# Patient Record
Sex: Female | Born: 1935 | Race: White | Hispanic: No | Smoking: Never smoker
Health system: Southern US, Community
[De-identification: ages and names within clinical notes are randomized; demographics above are authoritative.]

## PROBLEM LIST (undated history)

## (undated) DIAGNOSIS — R410 Disorientation, unspecified: Secondary | ICD-10-CM

## (undated) DIAGNOSIS — I10 Essential (primary) hypertension: Secondary | ICD-10-CM

## (undated) DIAGNOSIS — F0390 Unspecified dementia without behavioral disturbance: Secondary | ICD-10-CM

## (undated) DIAGNOSIS — C801 Malignant (primary) neoplasm, unspecified: Secondary | ICD-10-CM

## (undated) HISTORY — PX: BLADDER SURGERY: SHX569

## (undated) HISTORY — PX: ABDOMINAL HYSTERECTOMY: SHX81

---

## 1993-01-22 HISTORY — PX: BREAST LUMPECTOMY: SHX2

## 2000-10-24 ENCOUNTER — Ambulatory Visit (HOSPITAL_COMMUNITY): Admission: RE | Admit: 2000-10-24 | Discharge: 2000-10-24 | Payer: Self-pay | Admitting: Otolaryngology

## 2000-10-24 ENCOUNTER — Encounter: Payer: Self-pay | Admitting: Otolaryngology

## 2000-11-21 ENCOUNTER — Ambulatory Visit (HOSPITAL_COMMUNITY): Admission: RE | Admit: 2000-11-21 | Discharge: 2000-11-21 | Payer: Self-pay | Admitting: Oncology

## 2000-11-21 ENCOUNTER — Encounter (HOSPITAL_COMMUNITY): Payer: Self-pay | Admitting: Oncology

## 2001-03-01 ENCOUNTER — Emergency Department (HOSPITAL_COMMUNITY): Admission: EM | Admit: 2001-03-01 | Discharge: 2001-03-01 | Payer: Self-pay | Admitting: Emergency Medicine

## 2001-03-01 ENCOUNTER — Encounter: Payer: Self-pay | Admitting: Emergency Medicine

## 2001-04-08 ENCOUNTER — Encounter: Admission: RE | Admit: 2001-04-08 | Discharge: 2001-04-08 | Payer: Self-pay | Admitting: Oncology

## 2001-04-08 ENCOUNTER — Encounter (HOSPITAL_COMMUNITY): Admission: RE | Admit: 2001-04-08 | Discharge: 2001-05-08 | Payer: Self-pay | Admitting: Oncology

## 2001-11-25 ENCOUNTER — Encounter: Admission: RE | Admit: 2001-11-25 | Discharge: 2001-11-25 | Payer: Self-pay | Admitting: Oncology

## 2001-11-25 ENCOUNTER — Encounter (HOSPITAL_COMMUNITY): Payer: Self-pay | Admitting: Oncology

## 2001-11-25 ENCOUNTER — Encounter (HOSPITAL_COMMUNITY): Admission: RE | Admit: 2001-11-25 | Discharge: 2001-12-25 | Payer: Self-pay | Admitting: Oncology

## 2002-04-10 ENCOUNTER — Encounter: Admission: RE | Admit: 2002-04-10 | Discharge: 2002-04-10 | Payer: Self-pay | Admitting: Oncology

## 2002-04-10 ENCOUNTER — Encounter (HOSPITAL_COMMUNITY): Admission: RE | Admit: 2002-04-10 | Discharge: 2002-05-10 | Payer: Self-pay | Admitting: Oncology

## 2002-12-08 ENCOUNTER — Ambulatory Visit (HOSPITAL_COMMUNITY): Admission: RE | Admit: 2002-12-08 | Discharge: 2002-12-08 | Payer: Self-pay | Admitting: Oncology

## 2003-04-02 ENCOUNTER — Ambulatory Visit (HOSPITAL_COMMUNITY): Admission: RE | Admit: 2003-04-02 | Discharge: 2003-04-02 | Payer: Self-pay | Admitting: Pulmonary Disease

## 2003-04-12 ENCOUNTER — Encounter: Admission: RE | Admit: 2003-04-12 | Discharge: 2003-04-12 | Payer: Self-pay | Admitting: Oncology

## 2003-04-12 ENCOUNTER — Encounter (HOSPITAL_COMMUNITY): Admission: RE | Admit: 2003-04-12 | Discharge: 2003-05-12 | Payer: Self-pay | Admitting: Oncology

## 2003-04-29 ENCOUNTER — Ambulatory Visit (HOSPITAL_COMMUNITY): Admission: RE | Admit: 2003-04-29 | Discharge: 2003-04-29 | Payer: Self-pay | Admitting: Urology

## 2003-09-29 ENCOUNTER — Other Ambulatory Visit: Admission: RE | Admit: 2003-09-29 | Discharge: 2003-09-29 | Payer: Self-pay | Admitting: Dermatology

## 2004-01-13 ENCOUNTER — Encounter: Admission: RE | Admit: 2004-01-13 | Discharge: 2004-01-26 | Payer: Self-pay | Admitting: Oncology

## 2004-01-13 ENCOUNTER — Encounter (HOSPITAL_COMMUNITY): Admission: RE | Admit: 2004-01-13 | Discharge: 2004-01-26 | Payer: Self-pay | Admitting: Oncology

## 2004-04-11 ENCOUNTER — Encounter: Admission: RE | Admit: 2004-04-11 | Discharge: 2004-04-11 | Payer: Self-pay | Admitting: Oncology

## 2004-04-11 ENCOUNTER — Ambulatory Visit (HOSPITAL_COMMUNITY): Payer: Self-pay | Admitting: Oncology

## 2004-04-11 ENCOUNTER — Encounter (HOSPITAL_COMMUNITY): Admission: RE | Admit: 2004-04-11 | Discharge: 2004-05-11 | Payer: Self-pay | Admitting: Oncology

## 2005-01-23 ENCOUNTER — Encounter (HOSPITAL_COMMUNITY): Admission: RE | Admit: 2005-01-23 | Discharge: 2005-02-22 | Payer: Self-pay | Admitting: Oncology

## 2005-01-23 ENCOUNTER — Encounter: Admission: RE | Admit: 2005-01-23 | Discharge: 2005-01-23 | Payer: Self-pay | Admitting: Oncology

## 2005-04-11 ENCOUNTER — Ambulatory Visit (HOSPITAL_COMMUNITY): Payer: Self-pay | Admitting: Oncology

## 2005-04-11 ENCOUNTER — Encounter: Admission: RE | Admit: 2005-04-11 | Discharge: 2005-04-11 | Payer: Self-pay | Admitting: Oncology

## 2006-01-25 ENCOUNTER — Ambulatory Visit (HOSPITAL_COMMUNITY): Admission: RE | Admit: 2006-01-25 | Discharge: 2006-01-25 | Payer: Self-pay | Admitting: Obstetrics and Gynecology

## 2006-04-10 ENCOUNTER — Encounter (HOSPITAL_COMMUNITY): Admission: RE | Admit: 2006-04-10 | Discharge: 2006-05-10 | Payer: Self-pay | Admitting: Oncology

## 2006-04-10 ENCOUNTER — Ambulatory Visit (HOSPITAL_COMMUNITY): Payer: Self-pay | Admitting: Oncology

## 2006-07-03 ENCOUNTER — Emergency Department (HOSPITAL_COMMUNITY): Admission: EM | Admit: 2006-07-03 | Discharge: 2006-07-03 | Payer: Self-pay | Admitting: Emergency Medicine

## 2007-02-03 ENCOUNTER — Ambulatory Visit (HOSPITAL_COMMUNITY): Admission: RE | Admit: 2007-02-03 | Discharge: 2007-02-03 | Payer: Self-pay | Admitting: Obstetrics and Gynecology

## 2008-02-25 ENCOUNTER — Ambulatory Visit (HOSPITAL_COMMUNITY): Admission: RE | Admit: 2008-02-25 | Discharge: 2008-02-25 | Payer: Self-pay | Admitting: Pulmonary Disease

## 2008-05-09 ENCOUNTER — Emergency Department (HOSPITAL_COMMUNITY): Admission: EM | Admit: 2008-05-09 | Discharge: 2008-05-09 | Payer: Self-pay | Admitting: Emergency Medicine

## 2009-03-10 ENCOUNTER — Ambulatory Visit (HOSPITAL_COMMUNITY): Admission: RE | Admit: 2009-03-10 | Discharge: 2009-03-10 | Payer: Self-pay | Admitting: Pulmonary Disease

## 2009-05-02 ENCOUNTER — Ambulatory Visit (HOSPITAL_COMMUNITY): Admission: RE | Admit: 2009-05-02 | Discharge: 2009-05-02 | Payer: Self-pay | Admitting: Family Medicine

## 2010-03-16 ENCOUNTER — Other Ambulatory Visit: Payer: Self-pay | Admitting: Obstetrics and Gynecology

## 2010-03-16 DIAGNOSIS — Z139 Encounter for screening, unspecified: Secondary | ICD-10-CM

## 2010-03-21 ENCOUNTER — Ambulatory Visit (HOSPITAL_COMMUNITY)
Admission: RE | Admit: 2010-03-21 | Discharge: 2010-03-21 | Disposition: A | Discharge status: Home / Self Care | Payer: Medicare Other | Source: Ambulatory Visit | Attending: Obstetrics and Gynecology | Admitting: Obstetrics and Gynecology

## 2010-03-21 DIAGNOSIS — Z139 Encounter for screening, unspecified: Secondary | ICD-10-CM

## 2010-03-21 DIAGNOSIS — Z1231 Encounter for screening mammogram for malignant neoplasm of breast: Secondary | ICD-10-CM | POA: Insufficient documentation

## 2010-06-09 NOTE — Procedures (Signed)
NAME:  Sara Bowman, Sara Bowman                        ACCOUNT NO.:  [id number]   MEDICAL RECORD NO.:  [id number]                   PATIENT TYPE:  OUT   LOCATION:  RAD                                  FACILITY:  [facility]   PHYSICIAN:  Dani Gobble, MD                    DATE OF BIRTH:  1923/08/14   DATE OF PROCEDURE:  DATE OF DISCHARGE:                                  ECHOCARDIOGRAM   TECHNICAL QUALITY:  The technical quality of the study is adequate.   INDICATION:  [name] is a 77 year old female with a past medical  history of hypertension who has experienced a TIA.   FINDINGS:  1. The aorta is within normal limits at 2.9 cm.  2. The left atrium is also within normal limits at 2.9 cm.  The patient     appeared to be in sinus rhythm during this procedure.  No obvious clots     or masses were appreciated.  3. The intraventricular septum was notable for basal septal hypertrophy as     is common in the elderly.  Otherwise, the septum and posterior wall were     within normal limits in thickness.  4. The aortic valve was trileaflet with mild calcification on the right     coronary cusp.  Leaflet excursion was minimally diminished but certainly     adequate.  Mild aortic insufficiency was appreciated.  5. Doppler interrogation of the aortic valve reveals peak velocity of 1.9     meters per second corresponding to a peak gradient of 15 mmHg and a mean     gradient of 9 mmHg.  6. The mitral valve was mildly thickened but with normal leaflet excursion.     No mitral valve prolapse is noted.  Mild mitral annular calcification was     noted.  Mild mitral regurgitation was noted.  Doppler interrogation of     the mitral valve was within normal limits.  7. The pulmonic valve was not well visualized.  8. The tricuspid valve appeared grossly structurally normal.  9. The left ventricle was normal in size with the LVIDD measured at 3.4 cm     and the LVISD measured at 2.4 cm.  Overall, left  ventricular systolic     function was normal, and no regional wall motion abnormalities were     appreciated.  10.      The presence of diastolic dysfunction is inferred from pulse wave     Doppler across the mitral valve.  The right atrium grossly appears to be     within normal limits in size.  The right ventricle is mildly dilated but     with preserved right ventricular systolic function.  11.      The IVC is normal in size with good collapse.   IMPRESSION:  1. Mild basal septal hypertrophy.  2. Mild aortic sclerosis to very  mild aortic stenosis but with adequate     leaflet excursion remaining.  3. Mild aortic insufficiency.  4. Mild mitral annular calcification.  5. Mild mitral regurgitation.  6. Normal left ventricular size and systolic function without regional wall     motion abnormality noted.  7. The presence of diastolic dysfunction is inferred from pulse wave Doppler     across the mitral valve.  8. Mild right ventricular enlargement with preserved right ventricular     systolic function.      ___________________________________________                                            Dani Gobble, MD   AB/MEDQ  D:  04/02/2003  T:  04/02/2003  Job:  [id number]   cc:   Ramon Dredge L. Juanetta Gosling, M.D.  [address]  [place]  [place] [address]  Fax: [phone]

## 2010-11-09 LAB — BASIC METABOLIC PANEL
BUN: 7
CO2: 29
Chloride: 100
Creatinine, Ser: 0.73
Potassium: 3.8

## 2010-11-09 LAB — DIFFERENTIAL
Basophils Absolute: 0
Eosinophils Relative: 0
Lymphocytes Relative: 15
Lymphs Abs: 1.2
Monocytes Absolute: 0.2
Monocytes Relative: 3
Neutro Abs: 6.5

## 2010-11-09 LAB — CBC
HCT: 39.7
Hemoglobin: 13.9
RBC: 4.57
WBC: 7.9

## 2011-02-01 DIAGNOSIS — E785 Hyperlipidemia, unspecified: Secondary | ICD-10-CM | POA: Diagnosis not present

## 2011-02-01 DIAGNOSIS — M1990 Unspecified osteoarthritis, unspecified site: Secondary | ICD-10-CM | POA: Diagnosis not present

## 2011-02-01 DIAGNOSIS — I10 Essential (primary) hypertension: Secondary | ICD-10-CM | POA: Diagnosis not present

## 2011-04-04 ENCOUNTER — Other Ambulatory Visit (HOSPITAL_COMMUNITY): Payer: Self-pay | Admitting: Pulmonary Disease

## 2011-04-04 DIAGNOSIS — Z139 Encounter for screening, unspecified: Secondary | ICD-10-CM

## 2011-04-05 ENCOUNTER — Ambulatory Visit (HOSPITAL_COMMUNITY)
Admission: RE | Admit: 2011-04-05 | Discharge: 2011-04-05 | Disposition: A | Discharge status: Home / Self Care | Payer: Medicare Other | Source: Ambulatory Visit | Attending: Pulmonary Disease | Admitting: Pulmonary Disease

## 2011-04-05 DIAGNOSIS — Z139 Encounter for screening, unspecified: Secondary | ICD-10-CM

## 2011-04-05 DIAGNOSIS — Z1231 Encounter for screening mammogram for malignant neoplasm of breast: Secondary | ICD-10-CM | POA: Diagnosis not present

## 2011-05-24 DIAGNOSIS — D235 Other benign neoplasm of skin of trunk: Secondary | ICD-10-CM | POA: Diagnosis not present

## 2011-05-24 DIAGNOSIS — L821 Other seborrheic keratosis: Secondary | ICD-10-CM | POA: Diagnosis not present

## 2011-05-24 DIAGNOSIS — T6391XA Toxic effect of contact with unspecified venomous animal, accidental (unintentional), initial encounter: Secondary | ICD-10-CM | POA: Diagnosis not present

## 2011-05-24 DIAGNOSIS — Z85828 Personal history of other malignant neoplasm of skin: Secondary | ICD-10-CM | POA: Diagnosis not present

## 2011-05-31 DIAGNOSIS — G459 Transient cerebral ischemic attack, unspecified: Secondary | ICD-10-CM | POA: Diagnosis not present

## 2011-05-31 DIAGNOSIS — E785 Hyperlipidemia, unspecified: Secondary | ICD-10-CM | POA: Diagnosis not present

## 2011-05-31 DIAGNOSIS — M1990 Unspecified osteoarthritis, unspecified site: Secondary | ICD-10-CM | POA: Diagnosis not present

## 2011-05-31 DIAGNOSIS — I10 Essential (primary) hypertension: Secondary | ICD-10-CM | POA: Diagnosis not present

## 2011-08-27 DIAGNOSIS — I10 Essential (primary) hypertension: Secondary | ICD-10-CM | POA: Diagnosis not present

## 2011-08-27 DIAGNOSIS — L0390 Cellulitis, unspecified: Secondary | ICD-10-CM | POA: Diagnosis not present

## 2011-08-27 DIAGNOSIS — L0291 Cutaneous abscess, unspecified: Secondary | ICD-10-CM | POA: Diagnosis not present

## 2011-10-02 DIAGNOSIS — I10 Essential (primary) hypertension: Secondary | ICD-10-CM | POA: Diagnosis not present

## 2011-10-02 DIAGNOSIS — E785 Hyperlipidemia, unspecified: Secondary | ICD-10-CM | POA: Diagnosis not present

## 2011-10-02 DIAGNOSIS — G459 Transient cerebral ischemic attack, unspecified: Secondary | ICD-10-CM | POA: Diagnosis not present

## 2011-12-01 DIAGNOSIS — Z23 Encounter for immunization: Secondary | ICD-10-CM | POA: Diagnosis not present

## 2012-01-31 DIAGNOSIS — G459 Transient cerebral ischemic attack, unspecified: Secondary | ICD-10-CM | POA: Diagnosis not present

## 2012-01-31 DIAGNOSIS — E785 Hyperlipidemia, unspecified: Secondary | ICD-10-CM | POA: Diagnosis not present

## 2012-01-31 DIAGNOSIS — I10 Essential (primary) hypertension: Secondary | ICD-10-CM | POA: Diagnosis not present

## 2012-05-24 DIAGNOSIS — N390 Urinary tract infection, site not specified: Secondary | ICD-10-CM | POA: Diagnosis not present

## 2012-05-29 DIAGNOSIS — N390 Urinary tract infection, site not specified: Secondary | ICD-10-CM | POA: Diagnosis not present

## 2012-05-29 DIAGNOSIS — Z79899 Other long term (current) drug therapy: Secondary | ICD-10-CM | POA: Diagnosis not present

## 2012-05-29 DIAGNOSIS — E785 Hyperlipidemia, unspecified: Secondary | ICD-10-CM | POA: Diagnosis not present

## 2012-05-29 DIAGNOSIS — G459 Transient cerebral ischemic attack, unspecified: Secondary | ICD-10-CM | POA: Diagnosis not present

## 2012-05-29 DIAGNOSIS — M1990 Unspecified osteoarthritis, unspecified site: Secondary | ICD-10-CM | POA: Diagnosis not present

## 2012-05-29 DIAGNOSIS — I10 Essential (primary) hypertension: Secondary | ICD-10-CM | POA: Diagnosis not present

## 2012-06-03 DIAGNOSIS — I10 Essential (primary) hypertension: Secondary | ICD-10-CM | POA: Diagnosis not present

## 2012-06-03 DIAGNOSIS — M1990 Unspecified osteoarthritis, unspecified site: Secondary | ICD-10-CM | POA: Diagnosis not present

## 2012-06-03 DIAGNOSIS — G459 Transient cerebral ischemic attack, unspecified: Secondary | ICD-10-CM | POA: Diagnosis not present

## 2012-06-03 DIAGNOSIS — Z79899 Other long term (current) drug therapy: Secondary | ICD-10-CM | POA: Diagnosis not present

## 2012-06-03 DIAGNOSIS — E785 Hyperlipidemia, unspecified: Secondary | ICD-10-CM | POA: Diagnosis not present

## 2012-09-15 DIAGNOSIS — I10 Essential (primary) hypertension: Secondary | ICD-10-CM | POA: Diagnosis not present

## 2012-09-15 DIAGNOSIS — M1990 Unspecified osteoarthritis, unspecified site: Secondary | ICD-10-CM | POA: Diagnosis not present

## 2012-09-15 DIAGNOSIS — E785 Hyperlipidemia, unspecified: Secondary | ICD-10-CM | POA: Diagnosis not present

## 2012-09-16 DIAGNOSIS — M79609 Pain in unspecified limb: Secondary | ICD-10-CM | POA: Diagnosis not present

## 2012-09-16 DIAGNOSIS — B351 Tinea unguium: Secondary | ICD-10-CM | POA: Diagnosis not present

## 2012-09-16 DIAGNOSIS — M2010 Hallux valgus (acquired), unspecified foot: Secondary | ICD-10-CM | POA: Diagnosis not present

## 2012-09-30 DIAGNOSIS — H52229 Regular astigmatism, unspecified eye: Secondary | ICD-10-CM | POA: Diagnosis not present

## 2012-09-30 DIAGNOSIS — H5200 Hypermetropia, unspecified eye: Secondary | ICD-10-CM | POA: Diagnosis not present

## 2012-09-30 DIAGNOSIS — H353190 Nonexudative age-related macular degeneration, unspecified eye, stage unspecified: Secondary | ICD-10-CM | POA: Diagnosis not present

## 2012-09-30 DIAGNOSIS — H524 Presbyopia: Secondary | ICD-10-CM | POA: Diagnosis not present

## 2012-10-20 DIAGNOSIS — H18419 Arcus senilis, unspecified eye: Secondary | ICD-10-CM | POA: Diagnosis not present

## 2012-10-20 DIAGNOSIS — H02839 Dermatochalasis of unspecified eye, unspecified eyelid: Secondary | ICD-10-CM | POA: Diagnosis not present

## 2012-10-20 DIAGNOSIS — H2510 Age-related nuclear cataract, unspecified eye: Secondary | ICD-10-CM | POA: Diagnosis not present

## 2012-10-20 DIAGNOSIS — H18459 Nodular corneal degeneration, unspecified eye: Secondary | ICD-10-CM | POA: Diagnosis not present

## 2012-10-20 DIAGNOSIS — H25019 Cortical age-related cataract, unspecified eye: Secondary | ICD-10-CM | POA: Diagnosis not present

## 2012-11-12 DIAGNOSIS — H2510 Age-related nuclear cataract, unspecified eye: Secondary | ICD-10-CM | POA: Diagnosis not present

## 2012-11-12 DIAGNOSIS — H269 Unspecified cataract: Secondary | ICD-10-CM | POA: Diagnosis not present

## 2012-11-19 DIAGNOSIS — H2510 Age-related nuclear cataract, unspecified eye: Secondary | ICD-10-CM | POA: Diagnosis not present

## 2012-11-19 DIAGNOSIS — H269 Unspecified cataract: Secondary | ICD-10-CM | POA: Diagnosis not present

## 2012-12-16 DIAGNOSIS — E785 Hyperlipidemia, unspecified: Secondary | ICD-10-CM | POA: Diagnosis not present

## 2012-12-16 DIAGNOSIS — I10 Essential (primary) hypertension: Secondary | ICD-10-CM | POA: Diagnosis not present

## 2012-12-16 DIAGNOSIS — Z23 Encounter for immunization: Secondary | ICD-10-CM | POA: Diagnosis not present

## 2012-12-16 DIAGNOSIS — M1990 Unspecified osteoarthritis, unspecified site: Secondary | ICD-10-CM | POA: Diagnosis not present

## 2012-12-16 DIAGNOSIS — Q842 Other congenital malformations of hair: Secondary | ICD-10-CM | POA: Diagnosis not present

## 2013-04-14 DIAGNOSIS — K2100 Gastro-esophageal reflux disease with esophagitis, without bleeding: Secondary | ICD-10-CM | POA: Diagnosis not present

## 2013-04-14 DIAGNOSIS — J309 Allergic rhinitis, unspecified: Secondary | ICD-10-CM | POA: Diagnosis not present

## 2013-04-14 DIAGNOSIS — I10 Essential (primary) hypertension: Secondary | ICD-10-CM | POA: Diagnosis not present

## 2013-04-14 DIAGNOSIS — R609 Edema, unspecified: Secondary | ICD-10-CM | POA: Diagnosis not present

## 2013-07-15 DIAGNOSIS — Z961 Presence of intraocular lens: Secondary | ICD-10-CM | POA: Diagnosis not present

## 2013-07-15 DIAGNOSIS — H26499 Other secondary cataract, unspecified eye: Secondary | ICD-10-CM | POA: Diagnosis not present

## 2013-07-29 DIAGNOSIS — H26499 Other secondary cataract, unspecified eye: Secondary | ICD-10-CM | POA: Diagnosis not present

## 2013-07-29 DIAGNOSIS — Z961 Presence of intraocular lens: Secondary | ICD-10-CM | POA: Diagnosis not present

## 2013-08-13 DIAGNOSIS — G459 Transient cerebral ischemic attack, unspecified: Secondary | ICD-10-CM | POA: Diagnosis not present

## 2013-08-13 DIAGNOSIS — I10 Essential (primary) hypertension: Secondary | ICD-10-CM | POA: Diagnosis not present

## 2013-08-13 DIAGNOSIS — E785 Hyperlipidemia, unspecified: Secondary | ICD-10-CM | POA: Diagnosis not present

## 2013-08-13 DIAGNOSIS — M1990 Unspecified osteoarthritis, unspecified site: Secondary | ICD-10-CM | POA: Diagnosis not present

## 2013-12-06 DIAGNOSIS — Z23 Encounter for immunization: Secondary | ICD-10-CM | POA: Diagnosis not present

## 2013-12-14 DIAGNOSIS — I10 Essential (primary) hypertension: Secondary | ICD-10-CM | POA: Diagnosis not present

## 2013-12-14 DIAGNOSIS — K219 Gastro-esophageal reflux disease without esophagitis: Secondary | ICD-10-CM | POA: Diagnosis not present

## 2013-12-14 DIAGNOSIS — M158 Other polyosteoarthritis: Secondary | ICD-10-CM | POA: Diagnosis not present

## 2013-12-14 DIAGNOSIS — F0390 Unspecified dementia without behavioral disturbance: Secondary | ICD-10-CM | POA: Diagnosis not present

## 2014-02-11 DIAGNOSIS — I10 Essential (primary) hypertension: Secondary | ICD-10-CM | POA: Diagnosis not present

## 2014-02-11 DIAGNOSIS — F0390 Unspecified dementia without behavioral disturbance: Secondary | ICD-10-CM | POA: Diagnosis not present

## 2014-02-11 DIAGNOSIS — M158 Other polyosteoarthritis: Secondary | ICD-10-CM | POA: Diagnosis not present

## 2014-02-11 DIAGNOSIS — G459 Transient cerebral ischemic attack, unspecified: Secondary | ICD-10-CM | POA: Diagnosis not present

## 2014-03-04 DIAGNOSIS — D225 Melanocytic nevi of trunk: Secondary | ICD-10-CM | POA: Diagnosis not present

## 2014-03-04 DIAGNOSIS — L570 Actinic keratosis: Secondary | ICD-10-CM | POA: Diagnosis not present

## 2014-03-04 DIAGNOSIS — X32XXXD Exposure to sunlight, subsequent encounter: Secondary | ICD-10-CM | POA: Diagnosis not present

## 2014-04-12 DIAGNOSIS — M158 Other polyosteoarthritis: Secondary | ICD-10-CM | POA: Diagnosis not present

## 2014-04-12 DIAGNOSIS — I10 Essential (primary) hypertension: Secondary | ICD-10-CM | POA: Diagnosis not present

## 2014-04-12 DIAGNOSIS — J309 Allergic rhinitis, unspecified: Secondary | ICD-10-CM | POA: Diagnosis not present

## 2014-04-12 DIAGNOSIS — F0390 Unspecified dementia without behavioral disturbance: Secondary | ICD-10-CM | POA: Diagnosis not present

## 2014-06-14 DIAGNOSIS — F0390 Unspecified dementia without behavioral disturbance: Secondary | ICD-10-CM | POA: Diagnosis not present

## 2014-06-14 DIAGNOSIS — R42 Dizziness and giddiness: Secondary | ICD-10-CM | POA: Diagnosis not present

## 2014-06-14 DIAGNOSIS — K210 Gastro-esophageal reflux disease with esophagitis: Secondary | ICD-10-CM | POA: Diagnosis not present

## 2014-06-14 DIAGNOSIS — I10 Essential (primary) hypertension: Secondary | ICD-10-CM | POA: Diagnosis not present

## 2014-08-17 ENCOUNTER — Inpatient Hospital Stay (HOSPITAL_COMMUNITY)
Admission: EM | Admit: 2014-08-17 | Discharge: 2014-08-19 | DRG: 948 | Disposition: A | Discharge status: Home / Self Care | Payer: Medicare Other | Attending: Pulmonary Disease | Admitting: Pulmonary Disease

## 2014-08-17 ENCOUNTER — Emergency Department (HOSPITAL_COMMUNITY): Payer: Medicare Other

## 2014-08-17 ENCOUNTER — Encounter (HOSPITAL_COMMUNITY): Payer: Self-pay | Admitting: Emergency Medicine

## 2014-08-17 DIAGNOSIS — R748 Abnormal levels of other serum enzymes: Principal | ICD-10-CM | POA: Diagnosis present

## 2014-08-17 DIAGNOSIS — R5381 Other malaise: Secondary | ICD-10-CM

## 2014-08-17 DIAGNOSIS — I350 Nonrheumatic aortic (valve) stenosis: Secondary | ICD-10-CM | POA: Diagnosis present

## 2014-08-17 DIAGNOSIS — R7989 Other specified abnormal findings of blood chemistry: Secondary | ICD-10-CM | POA: Diagnosis not present

## 2014-08-17 DIAGNOSIS — R410 Disorientation, unspecified: Secondary | ICD-10-CM | POA: Diagnosis not present

## 2014-08-17 DIAGNOSIS — E440 Moderate protein-calorie malnutrition: Secondary | ICD-10-CM | POA: Diagnosis present

## 2014-08-17 DIAGNOSIS — F0391 Unspecified dementia with behavioral disturbance: Secondary | ICD-10-CM | POA: Diagnosis not present

## 2014-08-17 DIAGNOSIS — N390 Urinary tract infection, site not specified: Secondary | ICD-10-CM | POA: Diagnosis present

## 2014-08-17 DIAGNOSIS — Z853 Personal history of malignant neoplasm of breast: Secondary | ICD-10-CM | POA: Diagnosis not present

## 2014-08-17 DIAGNOSIS — Z90710 Acquired absence of both cervix and uterus: Secondary | ICD-10-CM

## 2014-08-17 DIAGNOSIS — R4182 Altered mental status, unspecified: Secondary | ICD-10-CM | POA: Diagnosis present

## 2014-08-17 DIAGNOSIS — I10 Essential (primary) hypertension: Secondary | ICD-10-CM | POA: Diagnosis not present

## 2014-08-17 DIAGNOSIS — F03918 Unspecified dementia, unspecified severity, with other behavioral disturbance: Secondary | ICD-10-CM

## 2014-08-17 DIAGNOSIS — G9389 Other specified disorders of brain: Secondary | ICD-10-CM | POA: Diagnosis not present

## 2014-08-17 DIAGNOSIS — E871 Hypo-osmolality and hyponatremia: Secondary | ICD-10-CM | POA: Diagnosis not present

## 2014-08-17 DIAGNOSIS — Z681 Body mass index (BMI) 19 or less, adult: Secondary | ICD-10-CM | POA: Diagnosis not present

## 2014-08-17 DIAGNOSIS — R9430 Abnormal result of cardiovascular function study, unspecified: Secondary | ICD-10-CM | POA: Diagnosis not present

## 2014-08-17 DIAGNOSIS — R778 Other specified abnormalities of plasma proteins: Secondary | ICD-10-CM | POA: Diagnosis present

## 2014-08-17 DIAGNOSIS — E785 Hyperlipidemia, unspecified: Secondary | ICD-10-CM | POA: Diagnosis present

## 2014-08-17 DIAGNOSIS — Z79899 Other long term (current) drug therapy: Secondary | ICD-10-CM

## 2014-08-17 DIAGNOSIS — I340 Nonrheumatic mitral (valve) insufficiency: Secondary | ICD-10-CM | POA: Diagnosis not present

## 2014-08-17 HISTORY — DX: Disorientation, unspecified: R41.0

## 2014-08-17 HISTORY — DX: Malignant (primary) neoplasm, unspecified: C80.1

## 2014-08-17 HISTORY — DX: Unspecified dementia, unspecified severity, without behavioral disturbance, psychotic disturbance, mood disturbance, and anxiety: F03.90

## 2014-08-17 HISTORY — DX: Essential (primary) hypertension: I10

## 2014-08-17 LAB — COMPREHENSIVE METABOLIC PANEL
ALBUMIN: 4.5 g/dL (ref 3.5–5.0)
ALT: 19 U/L (ref 14–54)
AST: 34 U/L (ref 15–41)
Alkaline Phosphatase: 69 U/L (ref 38–126)
Anion gap: 9 (ref 5–15)
BUN: 11 mg/dL (ref 6–20)
CO2: 26 mmol/L (ref 22–32)
CREATININE: 0.63 mg/dL (ref 0.44–1.00)
Calcium: 9.7 mg/dL (ref 8.9–10.3)
Chloride: 97 mmol/L — ABNORMAL LOW (ref 101–111)
GFR calc Af Amer: >60 mL/min (ref >60)
Glucose, Bld: 88 mg/dL (ref 65–99)
Potassium: 4 mmol/L (ref 3.5–5.1)
Sodium: 132 mmol/L — ABNORMAL LOW (ref 135–145)
TOTAL PROTEIN: 7.6 g/dL (ref 6.5–8.1)
Total Bilirubin: 0.9 mg/dL (ref 0.3–1.2)

## 2014-08-17 LAB — URINE MICROSCOPIC-ADD ON

## 2014-08-17 LAB — CBC WITH DIFFERENTIAL/PLATELET
BASOS ABS: 0 10*3/uL (ref 0.0–0.1)
Basophils Relative: 0 % (ref 0–1)
EOS PCT: 0 % (ref 0–5)
Eosinophils Absolute: 0 10*3/uL (ref 0.0–0.7)
HCT: 40.8 % (ref 36.0–46.0)
HEMOGLOBIN: 14.4 g/dL (ref 12.0–15.0)
Lymphocytes Relative: 26 % (ref 12–46)
Lymphs Abs: 2 10*3/uL (ref 0.7–4.0)
MCH: 31 pg (ref 26.0–34.0)
MCHC: 35.3 g/dL (ref 30.0–36.0)
MCV: 87.9 fL (ref 78.0–100.0)
Monocytes Absolute: 0.4 10*3/uL (ref 0.1–1.0)
Monocytes Relative: 6 % (ref 3–12)
Neutro Abs: 5.3 10*3/uL (ref 1.7–7.7)
Neutrophils Relative %: 68 % (ref 43–77)
Platelets: 196 10*3/uL (ref 150–400)
RBC: 4.64 MIL/uL (ref 3.87–5.11)
RDW: 12.5 % (ref 11.5–15.5)
WBC: 7.8 10*3/uL (ref 4.0–10.5)

## 2014-08-17 LAB — RAPID URINE DRUG SCREEN, HOSP PERFORMED
AMPHETAMINES: NOT DETECTED
Barbiturates: NOT DETECTED
Benzodiazepines: POSITIVE — AB
Cocaine: NOT DETECTED
OPIATES: NOT DETECTED
Tetrahydrocannabinol: NOT DETECTED

## 2014-08-17 LAB — URINALYSIS, ROUTINE W REFLEX MICROSCOPIC
Bilirubin Urine: NEGATIVE
Glucose, UA: NEGATIVE mg/dL
Hgb urine dipstick: NEGATIVE
KETONES UR: NEGATIVE mg/dL
NITRITE: NEGATIVE
Protein, ur: NEGATIVE mg/dL
Specific Gravity, Urine: 1.01 (ref 1.005–1.030)
Urobilinogen, UA: 0.2 mg/dL (ref 0.0–1.0)
pH: 7 (ref 5.0–8.0)

## 2014-08-17 LAB — TROPONIN I
Troponin I: 0.19 ng/mL — ABNORMAL HIGH (ref <0.031)
Troponin I: 0.23 ng/mL — ABNORMAL HIGH (ref <0.031)

## 2014-08-17 LAB — LACTIC ACID, PLASMA
Lactic Acid, Venous: 1 mmol/L (ref 0.5–2.0)
Lactic Acid, Venous: 0.7 mmol/L (ref 0.5–2.0)

## 2014-08-17 MED ORDER — LISINOPRIL 10 MG PO TABS
20.0000 mg | ORAL_TABLET | Freq: Every day | ORAL | Status: DC
  Administered 2014-08-18 – 2014-08-19 (×2): 20 mg via ORAL
  Filled 2014-08-17 (×2): qty 2

## 2014-08-17 MED ORDER — HEPARIN SODIUM (PORCINE) 5000 UNIT/ML IJ SOLN
5000.0000 [IU] | Freq: Three times a day (TID) | INTRAMUSCULAR | Status: DC
  Administered 2014-08-17 – 2014-08-19 (×5): 5000 [IU] via SUBCUTANEOUS
  Filled 2014-08-17 (×5): qty 1

## 2014-08-17 MED ORDER — ACETAMINOPHEN 650 MG RE SUPP
650.0000 mg | Freq: Four times a day (QID) | RECTAL | Status: DC | PRN
Start: 2014-08-17 — End: 2014-08-19

## 2014-08-17 MED ORDER — SODIUM CHLORIDE 0.9 % IV SOLN
INTRAVENOUS | Status: DC
  Administered 2014-08-18: 21:00:00 via INTRAVENOUS

## 2014-08-17 MED ORDER — ALPRAZOLAM 0.25 MG PO TABS
0.2500 mg | ORAL_TABLET | Freq: Two times a day (BID) | ORAL | Status: DC | PRN
  Administered 2014-08-18: 0.25 mg via ORAL
  Filled 2014-08-17: qty 1

## 2014-08-17 MED ORDER — ACETAMINOPHEN 325 MG PO TABS: 650.0000 mg | ORAL_TABLET | Freq: Four times a day (QID) | ORAL | Status: DC | PRN

## 2014-08-17 MED ORDER — TORSEMIDE 20 MG PO TABS
10.0000 mg | ORAL_TABLET | Freq: Every day | ORAL | Status: DC
  Administered 2014-08-18 – 2014-08-19 (×2): 10 mg via ORAL
  Filled 2014-08-17 (×2): qty 1

## 2014-08-17 MED ORDER — AMLODIPINE BESYLATE 5 MG PO TABS
5.0000 mg | ORAL_TABLET | Freq: Every day | ORAL | Status: DC
  Administered 2014-08-17 – 2014-08-19 (×3): 5 mg via ORAL
  Filled 2014-08-17 (×3): qty 1

## 2014-08-17 MED ORDER — ONDANSETRON HCL 4 MG PO TABS
4.0000 mg | ORAL_TABLET | Freq: Four times a day (QID) | ORAL | Status: DC | PRN
Start: 2014-08-17 — End: 2014-08-19

## 2014-08-17 MED ORDER — OXYCODONE HCL 5 MG PO TABS: 5.0000 mg | ORAL_TABLET | ORAL | Status: DC | PRN

## 2014-08-17 MED ORDER — ONDANSETRON HCL 4 MG/2ML IJ SOLN: 4.0000 mg | Freq: Four times a day (QID) | INTRAMUSCULAR | Status: DC | PRN

## 2014-08-17 MED ORDER — SODIUM CHLORIDE 0.9 % IV SOLN
INTRAVENOUS | Status: AC
  Administered 2014-08-17: 1000 mL via INTRAVENOUS

## 2014-08-17 MED ORDER — SODIUM CHLORIDE 0.9 % IJ SOLN
3.0000 mL | Freq: Two times a day (BID) | INTRAMUSCULAR | Status: DC
  Administered 2014-08-17: 3 mL via INTRAVENOUS

## 2014-08-17 MED ORDER — HALOPERIDOL LACTATE 5 MG/ML IJ SOLN
2.5000 mg | Freq: Once | INTRAMUSCULAR | Status: AC
  Administered 2014-08-17: 2.5 mg via INTRAVENOUS
  Filled 2014-08-17: qty 1

## 2014-08-17 NOTE — Care Management (Signed)
CM referral ordered by EDP. CM discussed case with MD over phone. Pt being admitted for obs. Pt not capable of living at home alone due to confusion, daughter has agreed to assume care for patient. Patient's daughter interested in available home services. CM will see patient after she gets to floor and discuss options for assistance with family.

## 2014-08-17 NOTE — ED Notes (Signed)
Pt c/o sacral pain.  States "she was scooting on a ship last night".  Per daughter when pt ambulates she grimaces in pain.

## 2014-08-17 NOTE — ED Notes (Signed)
Pts daughter states that pt has been exhibiting some forgetfulness over the past few months.  States that she lives next door to the patient and this morning the pt was banging on her door very confused and behaving strangely.  States last seen normal at 8pm last night.

## 2014-08-17 NOTE — H&P (Addendum)
[facility] History and Physical  JNAE THOMASTON VOJ:[id number] DOB: January 20, 1924 DOA: 08/17/2014  Referring physician: Dr. Thurnell Garbe - [facility] PCP: Alonza Bogus, MD   Chief Complaint: AMS   HPI: Sara Bowman is a 81 y.o. female  Bilateral 5 caveat. Patient presenting with altered mental status and severe baseline dementia.  Progressive generalized weakness and change in mentation. Onset 2 weeks ago. Gradual. Getting worse. Patient lives at home by herself but is next door to her daughter regular cares for the patient. At baseline patient the scares her ADLs but is visited daily by her daughter. Patient ministers her own medications but does so without the help of a daily pill box. Patient became acutely more worse last night when she began saying very odd things to her daughter such as there being a bomb in her house and how she went on a ship to [place] and then. Per patient's daughter this morning patient states that she climbed out of her living room window. . Patient's daughter found her in her pajamas and once upper. Patient has no recollection of these events. There is no complaint of chest pain, short of breath, nausea, vomiting, diaphoresis, abdominal pain, palpitations.  Review of Systems:  Per history of present illness with no further review systems able to be obtained due to patient's mental status.    Past Medical History  Diagnosis Date  . Cancer     breast  . Hypertension   . Confusion   . Dementia    Past Surgical History  Procedure Laterality Date  . Abdominal hysterectomy     Social History:  reports that she has never smoked. She does not have any smokeless tobacco history on file. She reports that she does not drink alcohol or use illicit drugs.  No Known Allergies  Family History  Problem Relation Age of Onset  . Aneurysm Mother      Prior to Admission medications   Medication Sig Start Date End Date Taking? Authorizing Provider  ALPRAZolam  Duanne Moron) 0.25 MG tablet Take 1 tablet by mouth 2 (two) times daily as needed for anxiety.  07/08/14  Yes Historical Provider, MD  amLODipine (NORVASC) 5 MG tablet Take 1 tablet by mouth daily. 07/17/14  Yes Historical Provider, MD  ibuprofen (ADVIL,MOTRIN) 200 MG tablet Take 200 mg by mouth every 6 (six) hours as needed for headache.   Yes Historical Provider, MD  torsemide (DEMADEX) 10 MG tablet Take 1 tablet by mouth daily. 05/10/14  Yes Historical Provider, MD  lisinopril (PRINIVIL,ZESTRIL) 20 MG tablet Take 1 tablet by mouth daily. 07/17/14   Historical Provider, MD   Physical Exam: Filed Vitals:   08/17/14 1300 08/17/14 1443 08/17/14 1500 08/17/14 1730  BP: 96/84 154/90 145/75 160/77  Pulse: 94 92 80 82  Temp:    98 F (36.7 C)  TempSrc:    Oral  Resp: 19     Height:    5\' 2"  (1.575 m)  Weight:      SpO2: 100% 95% 100% 100%    Wt Readings from Last 3 Encounters:  08/17/14 48.535 kg (107 lb)    General: Appears calm but frail.  Eyes:  PERRL, normal lids, irises & conjunctiva ENT: Dry mucous membranes  Neck:  no LAD, masses or thyromegaly Cardiovascular: Faint heart sounds, regular rate and rhythm, 06 systolic murmur, No LE edema. Respiratory:  CTA bilaterally, no w/r/r. Normal respiratory effort. Abdomen:  soft, ntnd Skin:  no rash or induration seen on limited exam  Musculoskeletal:  grossly normal tone BUE/BLE Psychiatric:  Follows basic commands. Unable to have coherent conversation.  Neurologic:  No foacl deficits. CN 2-12 grossly intact          Labs on Admission:  Basic Metabolic Panel:  Recent Labs Lab 08/17/14 1313  NA 132*  K 4.0  CL 97*  CO2 26  GLUCOSE 88  BUN 11  CREATININE 0.63  CALCIUM 9.7   Liver Function Tests:  Recent Labs Lab 08/17/14 1313  AST 34  ALT 19  ALKPHOS 69  BILITOT 0.9  PROT 7.6  ALBUMIN 4.5   No results for input(s): LIPASE, AMYLASE in the last 168 hours. No results for input(s): AMMONIA in the last 168  hours. CBC:  Recent Labs Lab 08/17/14 1313  WBC 7.8  NEUTROABS 5.3  HGB 14.4  HCT 40.8  MCV 87.9  PLT 196   Cardiac Enzymes:  Recent Labs Lab 08/17/14 1313  TROPONINI 0.19*    BNP (last 3 results) No results for input(s): BNP in the last 8760 hours.  ProBNP (last 3 results) No results for input(s): PROBNP in the last 8760 hours.  CBG: No results for input(s): GLUCAP in the last 168 hours.  Radiological Exams on Admission: Dg Chest 2 View  08/17/2014   CLINICAL DATA:  Patient is confused and the heating strain distally.  EXAM: CHEST  2 VIEW  COMPARISON:  None.  FINDINGS: The heart size and mediastinal contours are within normal limits. The aorta is slightly uncoiled. There is no focal infiltrate, pulmonary edema, or pleural effusion. Nipple shadows identified in the left lung base. There is scoliosis of spine.  IMPRESSION: No active cardiopulmonary disease.   Electronically Signed   By: Abelardo Diesel M.D.   On: 08/17/2014 13:31   Mr Brain Wo Contrast (neuro Protocol)  08/17/2014   CLINICAL DATA:  Altered mental status  EXAM: MRI HEAD WITHOUT CONTRAST  TECHNIQUE: Multiplanar, multiecho pulse sequences of the brain and surrounding structures were obtained without intravenous contrast.  COMPARISON:  CT head 07/03/2006  FINDINGS: Moderate atrophy.  Negative for hydrocephalus.  Negative for acute infarct.  Mild chronic microvascular ischemic change in the white matter and thalamus. Brainstem intact. Chronic encephalomalacia right inferior frontal lobe unchanged from the prior CT likely related to prior head trauma.  Negative for intracranial hemorrhage. Negative for mass or edema. No shift of the midline structures  Paranasal sinuses clear. Negative orbit. Pituitary not enlarged. Cervical medullary junction normal.  IMPRESSION: Atrophy and chronic microvascular ischemia.  No acute infarct.  Encephalomalacia right inferior frontal lobe consistent with prior head trauma.   Electronically  Signed   By: Franchot Gallo M.D.   On: 08/17/2014 14:08     Assessment/Plan Principal Problem:   Elevated troponin Active Problems:   Altered mental state   Physical deconditioning   Hyponatremia   Essential hypertension   Dementia with behavioral disturbance   Elevated Troponin: 0.19 on admission. No previous cardiac history. Patient has never had a stress test or other cardiac workup.EKG unremarkable. Spoke to Dr. Debara Pickett of cardiology who agrees w/ obs and watch. If troponin >4-5 then start Hep fro 24-48hrs. Likely not a cath candidate.  - Telemetry - Cycle troponins - EKG in a.m. - Cardiology consult in a.m. If trop trends up  AMS: Patient with progressive worsening of her dementia and mental state over the course of the last 2 weeks. Of note patient lives at home and doses her own medications. There is concern for possible  medication etiology as there is no overt evidence of infectious or metabolic etiology. MR brain without acute process noted - Social work for possible nursing home placement - Monitor - PT/OT  Hyponatremia: Na 132. Likely secondary to poor nutrition - IVF - Nutrition consult  HTN: - continue Norvasc, lisinopril, torsemide (LE edema)    Code Status: FULL DVT Prophylaxis: Hep Family Communication: Daughter Disposition Plan: Pending improvement  Bianca Raneri Lenna Sciara, MD Family Medicine [facility] [email or web address] Password [email or web address]

## 2014-08-17 NOTE — Clinical Social Work Note (Signed)
CSW spoke with RN and EDP regarding referral. Pt has been very confused recently. Unsure if pt will require admission at this point. EDP advises that family is in room and willing to take pt home with them, but pt may need some home health services. Please consult CM if needed.  Sara Bowman, [place]

## 2014-08-17 NOTE — ED Provider Notes (Signed)
CSN: [id number]     Arrival date & time 08/17/14  1227 History   First MD Initiated Contact with Patient 08/17/14 1250     Chief Complaint  Patient presents with  . Altered Mental Status      Patient is a 81 y.o. female presenting with altered mental status. The history is provided by a caregiver and the patient. The history is limited by the condition of the patient (AMS).  Altered Mental Status Pt was seen at 1255. Per pt and her daughter: Pt's daughter states pt has been "more confused than usual" over the past 2 weeks. This morning, pt "climbed out of her living room window and ran to my house in her shirt, pajama bottoms and one slipper." Pt told her daughter "there was someone in the house" and "there was a bomb in the house." Pt also told her daughter she "went on a ship in [place]." Pt's daughter states pt has hx of confusion for the past 1 year, but has become "worse" over the past 2 weeks. Pt's daughter last saw pt per her baseline last evening approximately 2000. Pt herself currently denies any complaints other than "I can't really remember what happened today." Denies CP/SOB, no abd pain, no N/V/D, no back or neck pain, no focal motor weakness, no tingling/numbness in extremities.     Past Medical History  Diagnosis Date  . Cancer     breast  . Hypertension   . Confusion    Past Surgical History  Procedure Laterality Date  . Abdominal hysterectomy      History  Substance Use Topics  . Smoking status: Never Smoker   . Smokeless tobacco: Not on file  . Alcohol Use: No    Review of Systems  Unable to perform ROS: Mental status change      Allergies  Review of patient's allergies indicates no known allergies.  Home Medications   Prior to Admission medications   Medication Sig Start Date End Date Taking? Authorizing Provider  ALPRAZolam Duanne Moron) 0.25 MG tablet Take 1 tablet by mouth 2 (two) times daily as needed for anxiety.  07/08/14  Yes Historical Provider, MD   amLODipine (NORVASC) 5 MG tablet Take 1 tablet by mouth daily. 07/17/14  Yes Historical Provider, MD  ibuprofen (ADVIL,MOTRIN) 200 MG tablet Take 200 mg by mouth every 6 (six) hours as needed for headache.   Yes Historical Provider, MD  torsemide (DEMADEX) 10 MG tablet Take 1 tablet by mouth daily. 05/10/14  Yes Historical Provider, MD  lisinopril (PRINIVIL,ZESTRIL) 20 MG tablet Take 1 tablet by mouth daily. 07/17/14   Historical Provider, MD   BP 96/84 mmHg  Pulse 94  Temp(Src) 97.3 F (36.3 C) (Oral)  Resp 19  Ht 5\' 4"  (1.626 m)  Wt 107 lb (48.535 kg)  BMI 18.36 kg/m2  SpO2 100% Physical Exam  1300: Physical examination:  Nursing notes reviewed; Vital signs and O2 SAT reviewed;  Constitutional: Well developed, Well nourished, Well hydrated, In no acute distress; Head:  Normocephalic, atraumatic; Eyes: EOMI, PERRL, No scleral icterus; ENMT: Mouth and pharynx normal, Mucous membranes moist; Neck: Supple, Full range of motion, No lymphadenopathy; Cardiovascular: Regular rate and rhythm, No gallop; Respiratory: Breath sounds clear & equal bilaterally, No wheezes.  Speaking full sentences with ease, Normal respiratory effort/excursion; Chest: Nontender, Movement normal; Abdomen: Soft, Nontender, Nondistended, Normal bowel sounds; Genitourinary: No CVA tenderness; Extremities: Pulses normal, +small scabbed/healing abrasion left tibial area. No tenderness, No edema, No calf edema or asymmetry.; Neuro:  Awake, alert, confused re: time, place, events.  Major CN grossly intact. Speech clear.  No facial droop. Grips equal. Strength 5/5 equal bilat UE's and LE's.  DTR 2/4 equal bilat UE's and LE's.  No gross sensory deficits.  Normal cerebellar testing bilat UE's (finger-nose) and LE's (heel-shin)..; Skin: Color normal, Warm, Dry.    ED Course  Procedures     EKG Interpretation   Date/Time:  Tuesday August 17 2014 12:48:03 EDT Ventricular Rate:  86 PR Interval:  180 QRS Duration: 78 QT Interval:   352 QTC Calculation: 421 R Axis:   31 Text Interpretation:  Sinus rhythm LAE, consider biatrial enlargement When  compared with ECG of 07/03/2006 No significant change was found Confirmed  by [facility]  MD, Nunzio Cory [phone]) on 08/17/2014 1:19:07 PM      MDM  MDM Reviewed: previous chart, nursing note and vitals Reviewed previous: labs and ECG Interpretation: labs, ECG, x-ray and MRI      Results for orders placed or performed during the hospital encounter of 08/17/14  Comprehensive metabolic panel  Result Value Ref Range   Sodium 132 (L) 135 - 145 mmol/L   Potassium 4.0 3.5 - 5.1 mmol/L   Chloride 97 (L) 101 - 111 mmol/L   CO2 26 22 - 32 mmol/L   Glucose, Bld 88 65 - 99 mg/dL   BUN 11 6 - 20 mg/dL   Creatinine, Ser 0.63 0.44 - 1.00 mg/dL   Calcium 9.7 8.9 - 10.3 mg/dL   Total Protein 7.6 6.5 - 8.1 g/dL   Albumin 4.5 3.5 - 5.0 g/dL   AST 34 15 - 41 U/L   ALT 19 14 - 54 U/L   Alkaline Phosphatase 69 38 - 126 U/L   Total Bilirubin 0.9 0.3 - 1.2 mg/dL   GFR calc non Af Amer >60 >60 mL/min   GFR calc Af Amer >60 >60 mL/min   Anion gap 9 5 - 15  Urinalysis, Routine w reflex microscopic (not at [facility])  Result Value Ref Range   Color, Urine YELLOW YELLOW   APPearance CLEAR CLEAR   Specific Gravity, Urine 1.010 1.005 - 1.030   pH 7.0 5.0 - 8.0   Glucose, UA NEGATIVE NEGATIVE mg/dL   Hgb urine dipstick NEGATIVE NEGATIVE   Bilirubin Urine NEGATIVE NEGATIVE   Ketones, ur NEGATIVE NEGATIVE mg/dL   Protein, ur NEGATIVE NEGATIVE mg/dL   Urobilinogen, UA 0.2 0.0 - 1.0 mg/dL   Nitrite NEGATIVE NEGATIVE   Leukocytes, UA SMALL (A) NEGATIVE  Troponin I  Result Value Ref Range   Troponin I 0.19 (H) <0.031 ng/mL  Lactic acid, plasma  Result Value Ref Range   Lactic Acid, Venous 1.0 0.5 - 2.0 mmol/L  CBC with Differential  Result Value Ref Range   WBC 7.8 4.0 - 10.5 K/uL   RBC 4.64 3.87 - 5.11 MIL/uL   Hemoglobin 14.4 12.0 - 15.0 g/dL   HCT 40.8 36.0 - 46.0 %   MCV 87.9 78.0 -  100.0 fL   MCH 31.0 26.0 - 34.0 pg   MCHC 35.3 30.0 - 36.0 g/dL   RDW 12.5 11.5 - 15.5 %   Platelets 196 150 - 400 K/uL   Neutrophils Relative % 68 43 - 77 %   Neutro Abs 5.3 1.7 - 7.7 K/uL   Lymphocytes Relative 26 12 - 46 %   Lymphs Abs 2.0 0.7 - 4.0 K/uL   Monocytes Relative 6 3 - 12 %   Monocytes Absolute 0.4 0.1 - 1.0  K/uL   Eosinophils Relative 0 0 - 5 %   Eosinophils Absolute 0.0 0.0 - 0.7 K/uL   Basophils Relative 0 0 - 1 %   Basophils Absolute 0.0 0.0 - 0.1 K/uL  Urine microscopic-add on  Result Value Ref Range   Squamous Epithelial / LPF RARE RARE   WBC, UA 3-6 <3 WBC/hpf   RBC / HPF 3-6 <3 RBC/hpf   Bacteria, UA RARE RARE   Dg Chest 2 View 08/17/2014   CLINICAL DATA:  Patient is confused and the heating strain distally.  EXAM: CHEST  2 VIEW  COMPARISON:  None.  FINDINGS: The heart size and mediastinal contours are within normal limits. The aorta is slightly uncoiled. There is no focal infiltrate, pulmonary edema, or pleural effusion. Nipple shadows identified in the left lung base. There is scoliosis of spine.  IMPRESSION: No active cardiopulmonary disease.   Electronically Signed   By: Abelardo Diesel M.D.   On: 08/17/2014 13:31   Mr Brain Wo Contrast (neuro Protocol) 08/17/2014   CLINICAL DATA:  Altered mental status  EXAM: MRI HEAD WITHOUT CONTRAST  TECHNIQUE: Multiplanar, multiecho pulse sequences of the brain and surrounding structures were obtained without intravenous contrast.  COMPARISON:  CT head 07/03/2006  FINDINGS: Moderate atrophy.  Negative for hydrocephalus.  Negative for acute infarct.  Mild chronic microvascular ischemic change in the white matter and thalamus. Brainstem intact. Chronic encephalomalacia right inferior frontal lobe unchanged from the prior CT likely related to prior head trauma.  Negative for intracranial hemorrhage. Negative for mass or edema. No shift of the midline structures  Paranasal sinuses clear. Negative orbit. Pituitary not enlarged.  Cervical medullary junction normal.  IMPRESSION: Atrophy and chronic microvascular ischemia.  No acute infarct.  Encephalomalacia right inferior frontal lobe consistent with prior head trauma.   Electronically Signed   By: Franchot Gallo M.D.   On: 08/17/2014 14:08    1540:  Troponin elevated, but no acute STTW changes on EKG. Pt also denies CP. Elevation likely due to pt's activity this morning (climbing out a window and running to family member's house). CM called regarding d/c planning: will see pt upstairs.  Dx and testing d/w pt and family.  Questions answered.  Verb understanding, agreeable to admit. T/C to [facility] Dr. Marily Memos, case discussed, including:  HPI, pertinent PM/SHx, VS/PE, dx testing, ED course and treatment:  Agreeable to admit, requests to write temporary orders, obtain observation tele bed to Dr. Luan Pulling' service.   Francine Graven, DO 08/20/14 [phone]

## 2014-08-18 ENCOUNTER — Observation Stay (HOSPITAL_BASED_OUTPATIENT_CLINIC_OR_DEPARTMENT_OTHER): Payer: Medicare Other

## 2014-08-18 DIAGNOSIS — I340 Nonrheumatic mitral (valve) insufficiency: Secondary | ICD-10-CM | POA: Diagnosis not present

## 2014-08-18 DIAGNOSIS — R7989 Other specified abnormal findings of blood chemistry: Secondary | ICD-10-CM

## 2014-08-18 DIAGNOSIS — I10 Essential (primary) hypertension: Secondary | ICD-10-CM | POA: Diagnosis present

## 2014-08-18 DIAGNOSIS — E440 Moderate protein-calorie malnutrition: Secondary | ICD-10-CM | POA: Diagnosis not present

## 2014-08-18 DIAGNOSIS — E785 Hyperlipidemia, unspecified: Secondary | ICD-10-CM | POA: Diagnosis present

## 2014-08-18 DIAGNOSIS — I350 Nonrheumatic aortic (valve) stenosis: Secondary | ICD-10-CM | POA: Diagnosis present

## 2014-08-18 DIAGNOSIS — E871 Hypo-osmolality and hyponatremia: Secondary | ICD-10-CM | POA: Diagnosis not present

## 2014-08-18 DIAGNOSIS — Z79899 Other long term (current) drug therapy: Secondary | ICD-10-CM | POA: Diagnosis not present

## 2014-08-18 DIAGNOSIS — F0391 Unspecified dementia with behavioral disturbance: Secondary | ICD-10-CM | POA: Diagnosis not present

## 2014-08-18 DIAGNOSIS — Z853 Personal history of malignant neoplasm of breast: Secondary | ICD-10-CM | POA: Diagnosis not present

## 2014-08-18 DIAGNOSIS — N390 Urinary tract infection, site not specified: Secondary | ICD-10-CM | POA: Diagnosis not present

## 2014-08-18 DIAGNOSIS — Z681 Body mass index (BMI) 19 or less, adult: Secondary | ICD-10-CM | POA: Diagnosis not present

## 2014-08-18 DIAGNOSIS — R4182 Altered mental status, unspecified: Secondary | ICD-10-CM | POA: Diagnosis present

## 2014-08-18 DIAGNOSIS — R9430 Abnormal result of cardiovascular function study, unspecified: Secondary | ICD-10-CM | POA: Diagnosis not present

## 2014-08-18 DIAGNOSIS — Z90710 Acquired absence of both cervix and uterus: Secondary | ICD-10-CM | POA: Diagnosis not present

## 2014-08-18 DIAGNOSIS — R748 Abnormal levels of other serum enzymes: Secondary | ICD-10-CM | POA: Diagnosis not present

## 2014-08-18 LAB — BASIC METABOLIC PANEL
ANION GAP: 7 (ref 5–15)
BUN: 8 mg/dL (ref 6–20)
CO2: 25 mmol/L (ref 22–32)
Calcium: 8.8 mg/dL — ABNORMAL LOW (ref 8.9–10.3)
Chloride: 102 mmol/L (ref 101–111)
Creatinine, Ser: 0.58 mg/dL (ref 0.44–1.00)
GFR calc Af Amer: >60 mL/min (ref >60)
GFR calc non Af Amer: >60 mL/min (ref >60)
GLUCOSE: 87 mg/dL (ref 65–99)
Potassium: 3.5 mmol/L (ref 3.5–5.1)
Sodium: 134 mmol/L — ABNORMAL LOW (ref 135–145)

## 2014-08-18 LAB — TROPONIN I
TROPONIN I: 0.2 ng/mL — AB (ref <0.031)
Troponin I: 0.21 ng/mL — ABNORMAL HIGH (ref <0.031)

## 2014-08-18 MED ORDER — ASPIRIN EC 81 MG PO TBEC
81.0000 mg | DELAYED_RELEASE_TABLET | Freq: Every day | ORAL | Status: DC
  Administered 2014-08-18 – 2014-08-19 (×2): 81 mg via ORAL
  Filled 2014-08-18 (×2): qty 1

## 2014-08-18 MED ORDER — CEFTRIAXONE SODIUM IN DEXTROSE 20 MG/ML IV SOLN
1.0000 g | INTRAVENOUS | Status: DC
  Filled 2014-08-18: qty 50

## 2014-08-18 MED ORDER — DEXTROSE 5 % IV SOLN
1.0000 g | INTRAVENOUS | Status: DC
  Administered 2014-08-18 – 2014-08-19 (×2): 1 g via INTRAVENOUS
  Filled 2014-08-18 (×3): qty 10

## 2014-08-18 MED ORDER — ENSURE ENLIVE PO LIQD
237.0000 mL | Freq: Two times a day (BID) | ORAL | Status: DC
  Administered 2014-08-18 – 2014-08-19 (×2): 237 mL via ORAL

## 2014-08-18 NOTE — Clinical Social Work Note (Signed)
CSW received consult for possible placement. Daughter lives next door and plans to stay with pt at night and have someone come in during the day to be with pt. PT evaluated pt and recommends home health. CSW will sign off, but can be reconsulted if needed.  Benay Pike, [place]

## 2014-08-18 NOTE — Evaluation (Signed)
Physical Therapy Evaluation Patient Details Name: Sara Bowman MRN: [id number] DOB: [birth year] Today's Date: 08/18/2014   History of Present Illness  Progressive generalized weakness and change in mentation. Onset 2 weeks ago. Gradual. Getting worse. Patient lives at home by herself but is next door to her daughter regular cares for the patient. At baseline patient the scares her ADLs but is visited daily by her daughter. Patient ministers her own medications but does so without the help of a daily pill box. Patient became acutely more worse last night when she began saying very odd things to her daughter such as there being a bomb in her house and how she went on a ship to [place] and then. Per patient's daughter this morning patient states that she climbed out of her living room window. . Patient's daughter found her in her pajamas and once upper. Patient has no recollection of these events. There is no complaint of chest pain, short of breath, nausea, vomiting, diaphoresis, abdominal pain, palpitations.  Clinical Impression   Pt was seen for evaluation, daughter present.  She was very alert and cooperative but has significant dementia.  She definitely has generalized weakness and decreased standing balance as one would expect with dementia.  She seems to be aware of her fragility and is normally moving carefully within the home according to her daughter.  I am recommending HHPT to evaluate pt in her home setting for safety as there are concerns for her navigating her steps and trying to walk into her back yard with no assistive device.  Daughter plans to stay with pt at night and will have someone come in during the day to stay with her.    Follow Up Recommendations Home health PT    Equipment Recommendations  None recommended by PT    Recommendations for Other Services   none    Precautions / Restrictions Precautions Precautions: Fall Restrictions Weight Bearing Restrictions: No       Mobility  Bed Mobility Overal bed mobility: Modified Independent                Transfers Overall transfer level: Modified independent Equipment used: None                Ambulation/Gait Ambulation/Gait assistance: Supervision Ambulation Distance (Feet): 200 Feet Assistive device: 1 person hand held assist Gait Pattern/deviations: Narrow base of support;Shuffle;Decreased dorsiflexion - right;Decreased dorsiflexion - left   Gait velocity interpretation: <1.8 ft/sec, indicative of risk for recurrent falls    Stairs            Wheelchair Mobility    Modified Rankin (Stroke Patients Only)       Balance Overall balance assessment: Needs assistance Sitting-balance support: No upper extremity supported;Feet supported Sitting balance-Leahy Scale: Good     Standing balance support: No upper extremity supported Standing balance-Leahy Scale: Fair                               Pertinent Vitals/Pain Pain Assessment: No/denies pain    Home Living Family/patient expects to be discharged to:: Private residence Living Arrangements: Alone Available Help at Discharge: Family;Neighbor;Available 24 hours/day Type of Home: House Home Access: Stairs to enter Entrance Stairs-Rails: None Entrance Stairs-Number of Steps: 2 Home Layout: One level Home Equipment: Walker - 2 wheels      Prior Function Level of Independence: Needs assistance   Gait / Transfers Assistance Needed: pt ambulates by holding onto  furniture in the home or holds daughter's arm when outside of the home...she does at times go outside with no assistive device but goes very slowly  ADL's / Homemaking Assistance Needed: needs assist with tub baths        Hand Dominance        Extremity/Trunk Assessment               Lower Extremity Assessment: Generalized weakness      Cervical / Trunk Assessment: Kyphotic  Communication   Communication: HOH  Cognition  Arousal/Alertness: Awake/alert Behavior During Therapy: WFL for tasks assessed/performed Overall Cognitive Status: History of cognitive impairments - at baseline                      General Comments      Exercises        Assessment/Plan    PT Assessment All further PT needs can be met in the next venue of care  PT Diagnosis Generalized weakness   PT Problem List Decreased strength;Decreased balance  PT Treatment Interventions     PT Goals (Current goals can be found in the Care Plan section) Acute Rehab PT Goals PT Goal Formulation: All assessment and education complete, DC therapy    Frequency     Barriers to discharge        Co-evaluation               End of Session Equipment Utilized During Treatment: Gait belt Activity Tolerance: Patient tolerated treatment well Patient left: in bed;with call bell/phone within reach;with bed alarm set;with family/visitor present      Functional Assessment Tool Used: clinical judgement Functional Limitation: Mobility: Walking and moving around Mobility: Walking and Moving Around Current Status (Z9728): At least 1 percent but less than 20 percent impaired, limited or restricted Mobility: Walking and Moving Around Goal Status [phone]): At least 1 percent but less than 20 percent impaired, limited or restricted Mobility: Walking and Moving Around Discharge Status [phone]): At least 1 percent but less than 20 percent impaired, limited or restricted    Time: 0836-0910 PT Time Calculation (min) (ACUTE ONLY): 34 min   Charges:   PT Evaluation $Initial PT Evaluation Tier I: 1 Procedure     PT G Codes:   PT G-Codes **NOT FOR INPATIENT CLASS** Functional Assessment Tool Used: clinical judgement Functional Limitation: Mobility: Walking and moving around Mobility: Walking and Moving Around Current Status (P9432): At least 1 percent but less than 20 percent impaired, limited or restricted Mobility: Walking and Moving  Around Goal Status [phone]): At least 1 percent but less than 20 percent impaired, limited or restricted Mobility: Walking and Moving Around Discharge Status [phone]): At least 1 percent but less than 20 percent impaired, limited or restricted    Sable Feil  PT 08/18/2014, 9:24 AM [phone]

## 2014-08-18 NOTE — Care Management Note (Signed)
Case Management Note  Patient Details  Name: Sara Bowman MRN: [id number] Date of Birth: [birth year]  Expected Discharge Date:    08/18/2014              Expected Discharge Plan:  Home/Self Care  In-House Referral:  NA  Discharge planning Services  CM Consult  Post Acute Care Choice:  NA Choice offered to:  NA  DME Arranged:    DME Agency:     HH Arranged:    [place] Agency:     Status of Service:  Completed, signed off  Medicare Important Message Given:    Date Medicare IM Given:    Medicare IM give by:    Date Additional Medicare IM Given:    Additional Medicare Important Message give by:     If discussed at [place] of Stay Meetings, dates discussed:    Additional Comments: Pt is from home, has advanced dementia and is no longer safe to stay at home by herself. CM spoke with daughter in patient's room. Pt lives next door to daughter. Pt's daughter will begin to stay with patient at night and will have family friend stay during the day. PT has recommended HH PT at discharge. Pt's daughter feels it is not needed, having someone new come into the home may make her confusion worse. Pt's daughter given list of agencies that provide PD care in case family needs it. Pt has walker at home and has no DME needs at this time. Pt observation, pt's daughter given notification and signed, copy given to daughter and original placed on pt's chart. No further CM needs.   Sherald Barge, RN 08/18/2014, 11:37 AM

## 2014-08-18 NOTE — Consult Note (Signed)
Reason for Consult:Elevated Troponins Referring Physician:Dr. Luan Pulling Cardiologist: new [name] is an 81 y.o. female.  HPI: This is a very pleasant 81 yr old female patient with dementia admitted with altered mental state and progressive weakness. Her Troponins are 0.19, 0.23, 0.21, 0.20. EKG NSR with poor R wave progression anterolateral. Na 132.She has no prior cardiac history. She live alone next door to her daughter. She walks her dog everyday. She denies any chest pain, palpitations, dyspnea, dyspnea on exertion, dizziness or presyncope. She has HTN and HLD treated. Never smoked, no family history of CAD.  Past Medical History  Diagnosis Date  . Cancer     breast  . Hypertension   . Confusion   . Dementia     Past Surgical History  Procedure Laterality Date  . Abdominal hysterectomy      Family History  Problem Relation Age of Onset  . Aneurysm Mother     Social History:  reports that she has never smoked. She does not have any smokeless tobacco history on file. She reports that she does not drink alcohol or use illicit drugs.  Allergies: No Known Allergies  Medications: Scheduled Meds: . amLODipine  5 mg Oral Daily  . cefTRIAXone (ROCEPHIN)  IV  1 g Intravenous Q24H  . heparin  5,000 Units Subcutaneous 3 times per day  . lisinopril  20 mg Oral Daily  . sodium chloride  3 mL Intravenous Q12H  . torsemide  10 mg Oral Daily   Continuous Infusions: . sodium chloride 100 mL/hr at 08/17/14 2158   PRN Meds:.acetaminophen **OR** acetaminophen, ALPRAZolam, ondansetron **OR** ondansetron (ZOFRAN) IV, oxyCODONE   Results for orders placed or performed during the hospital encounter of 08/17/14 (from the past 48 hour(s))  Urinalysis, Routine w reflex microscopic (not at [facility])     Status: Abnormal   Collection Time: 08/17/14  1:05 PM  Result Value Ref Range   Color, Urine YELLOW YELLOW   APPearance CLEAR CLEAR   Specific Gravity, Urine 1.010 1.005 -  1.030   pH 7.0 5.0 - 8.0   Glucose, UA NEGATIVE NEGATIVE mg/dL   Hgb urine dipstick NEGATIVE NEGATIVE   Bilirubin Urine NEGATIVE NEGATIVE   Ketones, ur NEGATIVE NEGATIVE mg/dL   Protein, ur NEGATIVE NEGATIVE mg/dL   Urobilinogen, UA 0.2 0.0 - 1.0 mg/dL   Nitrite NEGATIVE NEGATIVE   Leukocytes, UA SMALL (A) NEGATIVE  Urine microscopic-add on     Status: None   Collection Time: 08/17/14  1:05 PM  Result Value Ref Range   Squamous Epithelial / LPF RARE RARE   WBC, UA 3-6 <3 WBC/hpf   RBC / HPF 3-6 <3 RBC/hpf   Bacteria, UA RARE RARE  Comprehensive metabolic panel     Status: Abnormal   Collection Time: 08/17/14  1:13 PM  Result Value Ref Range   Sodium 132 (L) 135 - 145 mmol/L   Potassium 4.0 3.5 - 5.1 mmol/L   Chloride 97 (L) 101 - 111 mmol/L   CO2 26 22 - 32 mmol/L   Glucose, Bld 88 65 - 99 mg/dL   BUN 11 6 - 20 mg/dL   Creatinine, Ser 0.63 0.44 - 1.00 mg/dL   Calcium 9.7 8.9 - 10.3 mg/dL   Total Protein 7.6 6.5 - 8.1 g/dL   Albumin 4.5 3.5 - 5.0 g/dL   AST 34 15 - 41 U/L   ALT 19 14 - 54 U/L   Alkaline Phosphatase 69 38 - 126 U/L  Total Bilirubin 0.9 0.3 - 1.2 mg/dL   GFR calc non Af Amer >60 >60 mL/min   GFR calc Af Amer >60 >60 mL/min    Comment: (NOTE) The eGFR has been calculated using the CKD EPI equation. This calculation has not been validated in all clinical situations. eGFR's persistently <60 mL/min signify possible Chronic Kidney Disease.    Anion gap 9 5 - 15  Troponin I     Status: Abnormal   Collection Time: 08/17/14  1:13 PM  Result Value Ref Range   Troponin I 0.19 (H) <0.031 ng/mL    Comment:        PERSISTENTLY INCREASED TROPONIN VALUES IN THE RANGE OF 0.04-0.49 ng/mL CAN BE SEEN IN:       -UNSTABLE ANGINA       -CONGESTIVE HEART FAILURE       -MYOCARDITIS       -CHEST TRAUMA       -ARRYHTHMIAS       -LATE PRESENTING MYOCARDIAL INFARCTION       -COPD   CLINICAL FOLLOW-UP RECOMMENDED.   Lactic acid, plasma     Status: None   Collection  Time: 08/17/14  1:13 PM  Result Value Ref Range   Lactic Acid, Venous 1.0 0.5 - 2.0 mmol/L  CBC with Differential     Status: None   Collection Time: 08/17/14  1:13 PM  Result Value Ref Range   WBC 7.8 4.0 - 10.5 K/uL   RBC 4.64 3.87 - 5.11 MIL/uL   Hemoglobin 14.4 12.0 - 15.0 g/dL   HCT 40.8 36.0 - 46.0 %   MCV 87.9 78.0 - 100.0 fL   MCH 31.0 26.0 - 34.0 pg   MCHC 35.3 30.0 - 36.0 g/dL   RDW 12.5 11.5 - 15.5 %   Platelets 196 150 - 400 K/uL   Neutrophils Relative % 68 43 - 77 %   Neutro Abs 5.3 1.7 - 7.7 K/uL   Lymphocytes Relative 26 12 - 46 %   Lymphs Abs 2.0 0.7 - 4.0 K/uL   Monocytes Relative 6 3 - 12 %   Monocytes Absolute 0.4 0.1 - 1.0 K/uL   Eosinophils Relative 0 0 - 5 %   Eosinophils Absolute 0.0 0.0 - 0.7 K/uL   Basophils Relative 0 0 - 1 %   Basophils Absolute 0.0 0.0 - 0.1 K/uL  Lactic acid, plasma     Status: None   Collection Time: 08/17/14  4:14 PM  Result Value Ref Range   Lactic Acid, Venous 0.7 0.5 - 2.0 mmol/L  Troponin I (q 6hr x 3)     Status: Abnormal   Collection Time: 08/17/14  8:12 PM  Result Value Ref Range   Troponin I 0.23 (H) <0.031 ng/mL    Comment:        PERSISTENTLY INCREASED TROPONIN VALUES IN THE RANGE OF 0.04-0.49 ng/mL CAN BE SEEN IN:       -UNSTABLE ANGINA       -CONGESTIVE HEART FAILURE       -MYOCARDITIS       -CHEST TRAUMA       -ARRYHTHMIAS       -LATE PRESENTING MYOCARDIAL INFARCTION       -COPD   CLINICAL FOLLOW-UP RECOMMENDED.   Urine rapid drug screen (hosp performed)     Status: Abnormal   Collection Time: 08/17/14  9:36 PM  Result Value Ref Range   Opiates NONE DETECTED NONE DETECTED   Cocaine NONE DETECTED NONE DETECTED  Benzodiazepines POSITIVE (A) NONE DETECTED   Amphetamines NONE DETECTED NONE DETECTED   Tetrahydrocannabinol NONE DETECTED NONE DETECTED   Barbiturates NONE DETECTED NONE DETECTED    Comment:        DRUG SCREEN FOR MEDICAL PURPOSES ONLY.  IF CONFIRMATION IS NEEDED FOR ANY PURPOSE, NOTIFY  LAB WITHIN 5 DAYS.        LOWEST DETECTABLE LIMITS FOR URINE DRUG SCREEN Drug Class       Cutoff (ng/mL) Amphetamine      1000 Barbiturate      200 Benzodiazepine   854 Tricyclics       627 Opiates          300 Cocaine          300 THC              50   Troponin I (q 6hr x 3)     Status: Abnormal   Collection Time: 08/18/14  2:12 AM  Result Value Ref Range   Troponin I 0.21 (H) <0.031 ng/mL    Comment:        PERSISTENTLY INCREASED TROPONIN VALUES IN THE RANGE OF 0.04-0.49 ng/mL CAN BE SEEN IN:       -UNSTABLE ANGINA       -CONGESTIVE HEART FAILURE       -MYOCARDITIS       -CHEST TRAUMA       -ARRYHTHMIAS       -LATE PRESENTING MYOCARDIAL INFARCTION       -COPD   CLINICAL FOLLOW-UP RECOMMENDED.   Basic metabolic panel     Status: Abnormal   Collection Time: 08/18/14  2:12 AM  Result Value Ref Range   Sodium 134 (L) 135 - 145 mmol/L   Potassium 3.5 3.5 - 5.1 mmol/L   Chloride 102 101 - 111 mmol/L   CO2 25 22 - 32 mmol/L   Glucose, Bld 87 65 - 99 mg/dL   BUN 8 6 - 20 mg/dL   Creatinine, Ser 0.58 0.44 - 1.00 mg/dL   Calcium 8.8 (L) 8.9 - 10.3 mg/dL   GFR calc non Af Amer >60 >60 mL/min   GFR calc Af Amer >60 >60 mL/min    Comment: (NOTE) The eGFR has been calculated using the CKD EPI equation. This calculation has not been validated in all clinical situations. eGFR's persistently <60 mL/min signify possible Chronic Kidney Disease.    Anion gap 7 5 - 15  Troponin I (q 6hr x 3)     Status: Abnormal   Collection Time: 08/18/14  7:58 AM  Result Value Ref Range   Troponin I 0.20 (H) <0.031 ng/mL    Comment:        PERSISTENTLY INCREASED TROPONIN VALUES IN THE RANGE OF 0.04-0.49 ng/mL CAN BE SEEN IN:       -UNSTABLE ANGINA       -CONGESTIVE HEART FAILURE       -MYOCARDITIS       -CHEST TRAUMA       -ARRYHTHMIAS       -LATE PRESENTING MYOCARDIAL INFARCTION       -COPD   CLINICAL FOLLOW-UP RECOMMENDED.     Dg Chest 2 View  08/17/2014   CLINICAL DATA:   Patient is confused and the heating strain distally.  EXAM: CHEST  2 VIEW  COMPARISON:  None.  FINDINGS: The heart size and mediastinal contours are within normal limits. The aorta is slightly uncoiled. There is no focal infiltrate, pulmonary edema, or pleural effusion.  Nipple shadows identified in the left lung base. There is scoliosis of spine.  IMPRESSION: No active cardiopulmonary disease.   Electronically Signed   By: Abelardo Diesel M.D.   On: 08/17/2014 13:31   Mr Brain Wo Contrast (neuro Protocol)  08/17/2014   CLINICAL DATA:  Altered mental status  EXAM: MRI HEAD WITHOUT CONTRAST  TECHNIQUE: Multiplanar, multiecho pulse sequences of the brain and surrounding structures were obtained without intravenous contrast.  COMPARISON:  CT head 07/03/2006  FINDINGS: Moderate atrophy.  Negative for hydrocephalus.  Negative for acute infarct.  Mild chronic microvascular ischemic change in the white matter and thalamus. Brainstem intact. Chronic encephalomalacia right inferior frontal lobe unchanged from the prior CT likely related to prior head trauma.  Negative for intracranial hemorrhage. Negative for mass or edema. No shift of the midline structures  Paranasal sinuses clear. Negative orbit. Pituitary not enlarged. Cervical medullary junction normal.  IMPRESSION: Atrophy and chronic microvascular ischemia.  No acute infarct.  Encephalomalacia right inferior frontal lobe consistent with prior head trauma.   Electronically Signed   By: Franchot Gallo M.D.   On: 08/17/2014 14:08    ROS  See HPI Eyes: Negative Ears:Negative for hearing loss, tinnitus Cardiovascular: Negative for chest pain, palpitations,irregular heartbeat, dyspnea, dyspnea on exertion, near-syncope, orthopnea, paroxysmal nocturnal dyspnea and syncope,edema, claudication, cyanosis,.  Respiratory:   Negative for cough, hemoptysis, shortness of breath, sleep disturbances due to breathing, sputum production and wheezing.   Endocrine: Negative for  cold intolerance and heat intolerance.  Hematologic/Lymphatic: Negative for adenopathy and bleeding problem. Does not bruise/bleed easily.  Musculoskeletal: Negative.   Gastrointestinal: Negative for nausea, vomiting, reflux, abdominal pain, diarrhea, constipation.   Genitourinary: Negative for bladder incontinence, dysuria, flank pain, frequency, hematuria, hesitancy, nocturia and urgency.  Neurological:Alert and oriented to current place and time. Dementia Allergic/Immunologic: Negative for environmental allergies.  Blood pressure 140/70, pulse 81, temperature 97.5 F (36.4 C), temperature source Oral, resp. rate 18, height 5' 2"  (1.575 m), weight 107 lb (48.535 kg), SpO2 99 %. Physical Exam PHYSICAL EXAM: Thin, in no acute distress. Neck: No JVD, HJR, Bruit, or thyroid enlargement Lungs: No tachypnea, clear without wheezing, rales, or rhonchi Cardiovascular: RRR, PMI not displaced, 1-8/5 systolic murmur RSB/LSB, no gallops, bruit, thrill, or heave. Abdomen: BS normal. Soft without organomegaly, masses, lesions or tenderness. Extremities: without cyanosis, clubbing or edema. Good distal pulses bilateral SKin: Warm, no lesions or rashes  Musculoskeletal: No deformities Neuro: no focal signs    Assessment/Plan: Elevated Troponins: no chest pain or cardiac history. No acute EKG changes. Patient with dementia and 81 yo-doesn't want aggressive work up. Await 2 Decho.BP up and down to have to careful. On Norvasc, lisinopril and demadex at home.  HTN see above  Dementia with altered mental status-patient administers her own meds and may have taken them incorrectly.  Ermalinda Barrios 08/18/2014, 9:20 AM    Patient seen and discussed with PA [facility], I agree with her documentation above. 81 yo female with history of severe dementia and HTN admitted with altered mental status. Cardiology is consulted for elevated troponin. She has had no cardiac symptoms. Trop around 0.2 and overall flat. EKG no  ischemic changes. Echo pending  Nonspecific mildly elevated troponin that is flat in absence of any cardiac symptoms or EKG changes. Will f/u echo results. Based on presentation, advanced age, comorbidities including severe dementia would not pursue ischemic testing at this time. Continue medical therapy, start ASA.   Zandra Abts MD

## 2014-08-18 NOTE — Discharge Instructions (Signed)

## 2014-08-18 NOTE — Progress Notes (Signed)
Initial Nutrition Assessment  DOCUMENTATION CODES:   Non-severe (moderate) malnutrition in context of chronic illness  INTERVENTION:   Ensure Enlive po BID, each supplement provides 350 kcal and 20 grams of protein   RD will continue to follow.   NUTRITION DIAGNOSIS:   Predicted suboptimal nutrient intake related to chronic illness as evidenced by estimated needs, moderate depletion of body fat, moderate depletions of muscle mass.   GOAL:   Patient will meet greater than or equal to 90% of their needs    MONITOR:   PO intake, Supplement acceptance, Weight trends, Labs  REASON FOR ASSESSMENT:   Consult Assessment of nutrition requirement/status  ASSESSMENT:  Pt from home with support from daughter and plans to return there. She has advanced dementia and presents with increased weakness and confusion over the past 2 weeks. Pt says usual wt is 125# but is unable to provide timeframe and daughter is not present. Suspect her oral intake has declined with her onset of altered mental status. No weight hx available though to assess significant changes.  Will continue to follow.   Diet Order:  Diet Heart Room service appropriate?: Yes; Fluid consistency:: Thin  Skin:   WDL  Last BM:   7/26  Height:   Ht Readings from Last 1 Encounters:  08/17/14 5\' 2"  (1.575 m)    Weight:   Wt Readings from Last 1 Encounters:  08/17/14 107 lb (48.535 kg)    Ideal Body Weight:  50 kg  BMI:  Body mass index is 19.57 kg/(m^2).  Estimated Nutritional Needs:   Kcal:  1470-1617 kcal  Protein:  60-68 gr  Fluid:  1500 ml daily  EDUCATION NEEDS:   No education needs identified at this time  Colman Cater MS,RD,CSG,LDN Office: #[phone] Pager: [phone]

## 2014-08-19 DIAGNOSIS — E440 Moderate protein-calorie malnutrition: Secondary | ICD-10-CM | POA: Insufficient documentation

## 2014-08-19 LAB — URINE CULTURE

## 2014-08-19 MED ORDER — TRAZODONE HCL 50 MG PO TABS: 25.0000 mg | ORAL_TABLET | Freq: Every day | ORAL | Status: DC

## 2014-08-19 MED ORDER — ENSURE ENLIVE PO LIQD: 237.0000 mL | Freq: Two times a day (BID) | ORAL | Status: AC

## 2014-08-19 MED ORDER — CEFUROXIME AXETIL 250 MG PO TABS: 250.0000 mg | ORAL_TABLET | Freq: Two times a day (BID) | ORAL | Status: DC

## 2014-08-19 MED ORDER — ASPIRIN 81 MG PO TBEC: 81.0000 mg | DELAYED_RELEASE_TABLET | Freq: Every day | ORAL | Status: DC

## 2014-08-19 NOTE — Discharge Summary (Signed)
Physician Discharge Summary  Patient ID: MARYLENE MASEK MRN: [id number] DOB/AGE: [birth year] 81 y.o. Primary Care Physician:Victor Granados L, MD Admit date: 08/17/2014 Discharge date: 08/19/2014    Discharge Diagnoses:   Principal Problem:   Elevated troponin Active Problems:   Altered mental state   Physical deconditioning   Hyponatremia   Essential hypertension   Dementia with behavioral disturbance   Altered mental status   Malnutrition of moderate degree     Medication List    TAKE these medications        ALPRAZolam 0.25 MG tablet  Commonly known as:  XANAX  Take 1 tablet by mouth 2 (two) times daily as needed for anxiety.     amLODipine 5 MG tablet  Commonly known as:  NORVASC  Take 1 tablet by mouth daily.     aspirin 81 MG EC tablet  Take 1 tablet (81 mg total) by mouth daily.     cefUROXime 250 MG tablet  Commonly known as:  CEFTIN  Take 1 tablet (250 mg total) by mouth 2 (two) times daily with a meal.     feeding supplement (ENSURE ENLIVE) Liqd  Take 237 mLs by mouth 2 (two) times daily between meals.     ibuprofen 200 MG tablet  Commonly known as:  ADVIL,MOTRIN  Take 200 mg by mouth every 6 (six) hours as needed for headache.     lisinopril 20 MG tablet  Commonly known as:  PRINIVIL,ZESTRIL  Take 1 tablet by mouth daily.     torsemide 10 MG tablet  Commonly known as:  DEMADEX  Take 1 tablet by mouth daily.     traZODone 50 MG tablet  Commonly known as:  DESYREL  Take 0.5 tablets (25 mg total) by mouth at bedtime.        Discharged Condition: Improved    Consults: Cardiology  Significant Diagnostic Studies: Dg Chest 2 View  08/17/2014   CLINICAL DATA:  Patient is confused and the heating strain distally.  EXAM: CHEST  2 VIEW  COMPARISON:  None.  FINDINGS: The heart size and mediastinal contours are within normal limits. The aorta is slightly uncoiled. There is no focal infiltrate, pulmonary edema, or pleural effusion. Nipple shadows  identified in the left lung base. There is scoliosis of spine.  IMPRESSION: No active cardiopulmonary disease.   Electronically Signed   By: Abelardo Diesel M.D.   On: 08/17/2014 13:31   Mr Brain Wo Contrast (neuro Protocol)  08/17/2014   CLINICAL DATA:  Altered mental status  EXAM: MRI HEAD WITHOUT CONTRAST  TECHNIQUE: Multiplanar, multiecho pulse sequences of the brain and surrounding structures were obtained without intravenous contrast.  COMPARISON:  CT head 07/03/2006  FINDINGS: Moderate atrophy.  Negative for hydrocephalus.  Negative for acute infarct.  Mild chronic microvascular ischemic change in the white matter and thalamus. Brainstem intact. Chronic encephalomalacia right inferior frontal lobe unchanged from the prior CT likely related to prior head trauma.  Negative for intracranial hemorrhage. Negative for mass or edema. No shift of the midline structures  Paranasal sinuses clear. Negative orbit. Pituitary not enlarged. Cervical medullary junction normal.  IMPRESSION: Atrophy and chronic microvascular ischemia.  No acute infarct.  Encephalomalacia right inferior frontal lobe consistent with prior head trauma.   Electronically Signed   By: Franchot Gallo M.D.   On: 08/17/2014 14:08    Lab Results: Basic Metabolic Panel:  Recent Labs  08/17/14 1313 08/18/14 0212  NA 132* 134*  K 4.0 3.5  CL 97* 102  CO2 26 25  GLUCOSE 88 87  BUN 11 8  CREATININE 0.63 0.58  CALCIUM 9.7 8.8*   Liver Function Tests:  Recent Labs  08/17/14 1313  AST 34  ALT 19  ALKPHOS 69  BILITOT 0.9  PROT 7.6  ALBUMIN 4.5     CBC:  Recent Labs  08/17/14 1313  WBC 7.8  NEUTROABS 5.3  HGB 14.4  HCT 40.8  MCV 87.9  PLT 196    Recent Results (from the past 240 hour(s))  Urine culture     Status: None (Preliminary result)   Collection Time: 08/17/14  1:05 PM  Result Value Ref Range Status   Specimen Description URINE, CLEAN CATCH  Final   Special Requests NONE  Final   Culture   Final    NO  GROWTH < 24 HOURS Performed at [facility]    Report Status PENDING  Incomplete     Hospital Course: This is an 81 year old who came to the hospital because of increasing confusion. She had abnormal urinalysis and it was thought that she might have a urinary tract infection. She also had elevated troponin level but as best we can tell she did not have any chest pain. Her troponin level remained elevated. She had echocardiogram that did not show any acute changes and she had cardiology consultation and it was felt that she did not need any workup. She was back at baseline as far as her confusion was concerned and was ready for discharge  Discharge Exam: Blood pressure 137/74, pulse 97, temperature 98.6 F (37 C), temperature source Oral, resp. rate 18, height 5\' 2"  (1.575 m), weight 48.535 kg (107 lb), SpO2 96 %. She is mildly confused but no worse than usual. Her chest is clear  Disposition: She will be discharged home. I discussed home health services with her daughter and they don't want to do that at this point. She will be on antibiotic but urine culture thus far is negative.      Discharge Instructions    Discharge patient    Complete by:  As directed              Signed: Liliana Dang L   08/19/2014, 9:46 AM

## 2014-08-19 NOTE — Progress Notes (Signed)
Removed pt IV and telemetry, pt tolerated well.  Reviewed discharge instructions with pt and daughter, answered all questions at this time.

## 2014-08-19 NOTE — Progress Notes (Signed)
Consulting cardiologist: Jenkins Rouge MD Primary Cardiologist: Carlyle Dolly MD  Cardiology Specific Problem List: 1. Elevated Troponin 2. AoV stenosis   Subjective:     Doing well without complaints.   Objective:   Temp:  [98.1 F (36.7 C)-98.6 F (37 C)] 98.6 F (37 C) (07/28 0540) Pulse Rate:  [77-97] 97 (07/28 0540) Resp:  [16-18] 18 (07/28 0540) BP: (109-137)/(60-74) 137/74 mmHg (07/28 0540) SpO2:  [96 %-98 %] 96 % (07/28 0540) Last BM Date: 08/17/14  Filed Weights   08/17/14 1243  Weight: 107 lb (48.535 kg)    Intake/Output Summary (Last 24 hours) at 08/19/14 0933 Last data filed at 08/18/14 1751  Gross per 24 hour  Intake 2518.33 ml  Output    600 ml  Net 1918.33 ml    Telemetry:  NSR   Exam:  General: No acute distress.  HEENT: Conjunctiva and lids normal, oropharynx clear.  Lungs: Clear to auscultation, nonlabored.  Cardiac: No elevated JVP or bruits. RRR, 2/6 systolic murmur with preserved S2. no gallop or rub.   Abdomen: Normoactive bowel sounds, nontender, nondistended.  Extremities: No pitting edema, distal pulses full.  Neuropsychiatric: Alert and oriented x3, affect appropriate.   Lab Results:  Basic Metabolic Panel:  Recent Labs Lab 08/17/14 1313 08/18/14 0212  NA 132* 134*  K 4.0 3.5  CL 97* 102  CO2 26 25  GLUCOSE 88 87  BUN 11 8  CREATININE 0.63 0.58  CALCIUM 9.7 8.8*    Liver Function Tests:  Recent Labs Lab 08/17/14 1313  AST 34  ALT 19  ALKPHOS 69  BILITOT 0.9  PROT 7.6  ALBUMIN 4.5    CBC:  Recent Labs Lab 08/17/14 1313  WBC 7.8  HGB 14.4  HCT 40.8  MCV 87.9  PLT 196    Cardiac Enzymes:  Recent Labs Lab 08/17/14 2012 08/18/14 0212 08/18/14 0758  TROPONINI 0.23* 0.21* 0.20*    Echocardiogram 08/18/2014 Left ventricle: The cavity size was normal. Wall thickness was increased in a pattern of mild LVH. Systolic function was vigorous. The estimated ejection fraction was in the  range of 65% to 70%. Doppler parameters are consistent with abnormal left ventricular relaxation (grade 1 diastolic dysfunction). - Aortic valve: Severely calcified annulus. Trileaflet; severely thickened leaflets. There was mild to moderate stenosis. There was mild to moderate regurgitation. Valve area (VTI): 1.41 cm^2. Valve area (Vmax): 1.51 cm^2. Valve area (Vmean): 1.47 cm^2. Regurgitation pressure half-time: 635 ms. - Mitral valve: There was mild regurgitation. - Pulmonary arteries: PA peak pressure: 31 mm Hg (S). PASP is borderline elevated. - Technically difficult study.  Radiology: Dg Chest 2 View  08/17/2014   CLINICAL DATA:  Patient is confused and the heating strain distally.  EXAM: CHEST  2 VIEW  COMPARISON:  None.  FINDINGS: The heart size and mediastinal contours are within normal limits. The aorta is slightly uncoiled. There is no focal infiltrate, pulmonary edema, or pleural effusion. Nipple shadows identified in the left lung base. There is scoliosis of spine.  IMPRESSION: No active cardiopulmonary disease.   Electronically Signed   By: Abelardo Diesel M.D.   On: 08/17/2014 13:31   Mr Brain Wo Contrast (neuro Protocol)  08/17/2014   CLINICAL DATA:  Altered mental status  EXAM: MRI HEAD WITHOUT CONTRAST  TECHNIQUE: Multiplanar, multiecho pulse sequences of the brain and surrounding structures were obtained without intravenous contrast.  COMPARISON:  CT head 07/03/2006  FINDINGS: Moderate atrophy.  Negative for hydrocephalus.  Negative for acute  infarct.  Mild chronic microvascular ischemic change in the white matter and thalamus. Brainstem intact. Chronic encephalomalacia right inferior frontal lobe unchanged from the prior CT likely related to prior head trauma.  Negative for intracranial hemorrhage. Negative for mass or edema. No shift of the midline structures  Paranasal sinuses clear. Negative orbit. Pituitary not enlarged. Cervical medullary junction normal.   IMPRESSION: Atrophy and chronic microvascular ischemia.  No acute infarct.  Encephalomalacia right inferior frontal lobe consistent with prior head trauma.   Electronically Signed   By: Franchot Gallo M.D.   On: 08/17/2014 14:08    Medications:   Scheduled Medications: . amLODipine  5 mg Oral Daily  . aspirin EC  81 mg Oral Daily  . cefTRIAXone (ROCEPHIN)  IV  1 g Intravenous Q24H  . feeding supplement (ENSURE ENLIVE)  237 mL Oral BID BM  . heparin  5,000 Units Subcutaneous 3 times per day  . lisinopril  20 mg Oral Daily  . sodium chloride  3 mL Intravenous Q12H  . torsemide  10 mg Oral Daily    Infusions: . sodium chloride 100 mL/hr at 08/18/14 2126    PRN Medications: acetaminophen **OR** acetaminophen, ALPRAZolam, ondansetron **OR** ondansetron (ZOFRAN) IV, oxyCODONE   Assessment and Plan:   1. Demand Ischemia: Uncertain etiology. She has no complains of chest pain. Normal EF with grade I diastolic dysfunction.   2. Aortic Valve Stenosis: Mild to moderate stenosis with severely calcified annulus, severely thickened leaflets. Will continue conservative management. Will plan OP follow up with Dr. Harl Bowie.   Phill Myron. Lawrence NP [place]  08/19/2014, 9:33 AM   Patient examined chart reviewed.  Discussed post d/c care with daughter Elevated troponin with no clinical symptoms Conservative observation given age and dementia.  Exam remarkable for AS murmur and frailty.  Outpatient f/u PRN with Dr Harl Bowie.    Jenkins Rouge

## 2014-08-19 NOTE — Progress Notes (Signed)
Subjective: She feels much better. She has no new complaints. Her daughter says she is back to baseline  Objective: Vital signs in last 24 hours: Temp:  [98.1 F (36.7 C)-98.6 F (37 C)] 98.6 F (37 C) (07/28 0540) Pulse Rate:  [77-97] 97 (07/28 0540) Resp:  [16-18] 18 (07/28 0540) BP: (109-137)/(60-74) 137/74 mmHg (07/28 0540) SpO2:  [96 %-98 %] 96 % (07/28 0540) Weight change:  Last BM Date: 08/17/14  Intake/Output from previous day: 07/27 0701 - 07/28 0700 In: 2758.3 [P.O.:720; I.V.:1988.3; IV Piggyback:50] Out: 1000 [Urine:1000]  PHYSICAL EXAM General appearance: alert, cooperative and mild distress Resp: clear to auscultation bilaterally Cardio: regular rate and rhythm, S1, S2 normal, no murmur, click, rub or gallop GI: soft, non-tender; bowel sounds normal; no masses,  no organomegaly Extremities: extremities normal, atraumatic, no cyanosis or edema  Lab Results:  Results for orders placed or performed during the hospital encounter of 08/17/14 (from the past 48 hour(s))  Urinalysis, Routine w reflex microscopic (not at [facility])     Status: Abnormal   Collection Time: 08/17/14  1:05 PM  Result Value Ref Range   Color, Urine YELLOW YELLOW   APPearance CLEAR CLEAR   Specific Gravity, Urine 1.010 1.005 - 1.030   pH 7.0 5.0 - 8.0   Glucose, UA NEGATIVE NEGATIVE mg/dL   Hgb urine dipstick NEGATIVE NEGATIVE   Bilirubin Urine NEGATIVE NEGATIVE   Ketones, ur NEGATIVE NEGATIVE mg/dL   Protein, ur NEGATIVE NEGATIVE mg/dL   Urobilinogen, UA 0.2 0.0 - 1.0 mg/dL   Nitrite NEGATIVE NEGATIVE   Leukocytes, UA SMALL (A) NEGATIVE  Urine culture     Status: None (Preliminary result)   Collection Time: 08/17/14  1:05 PM  Result Value Ref Range   Specimen Description URINE, CLEAN CATCH    Special Requests NONE    Culture      NO GROWTH < 24 HOURS Performed at [facility]    Report Status PENDING   Urine microscopic-add on     Status: None   Collection Time: 08/17/14   1:05 PM  Result Value Ref Range   Squamous Epithelial / LPF RARE RARE   WBC, UA 3-6 <3 WBC/hpf   RBC / HPF 3-6 <3 RBC/hpf   Bacteria, UA RARE RARE  Comprehensive metabolic panel     Status: Abnormal   Collection Time: 08/17/14  1:13 PM  Result Value Ref Range   Sodium 132 (L) 135 - 145 mmol/L   Potassium 4.0 3.5 - 5.1 mmol/L   Chloride 97 (L) 101 - 111 mmol/L   CO2 26 22 - 32 mmol/L   Glucose, Bld 88 65 - 99 mg/dL   BUN 11 6 - 20 mg/dL   Creatinine, Ser 0.63 0.44 - 1.00 mg/dL   Calcium 9.7 8.9 - 10.3 mg/dL   Total Protein 7.6 6.5 - 8.1 g/dL   Albumin 4.5 3.5 - 5.0 g/dL   AST 34 15 - 41 U/L   ALT 19 14 - 54 U/L   Alkaline Phosphatase 69 38 - 126 U/L   Total Bilirubin 0.9 0.3 - 1.2 mg/dL   GFR calc non Af Amer >60 >60 mL/min   GFR calc Af Amer >60 >60 mL/min    Comment: (NOTE) The eGFR has been calculated using the CKD EPI equation. This calculation has not been validated in all clinical situations. eGFR's persistently <60 mL/min signify possible Chronic Kidney Disease.    Anion gap 9 5 - 15  Troponin I  Status: Abnormal   Collection Time: 08/17/14  1:13 PM  Result Value Ref Range   Troponin I 0.19 (H) <0.031 ng/mL    Comment:        PERSISTENTLY INCREASED TROPONIN VALUES IN THE RANGE OF 0.04-0.49 ng/mL CAN BE SEEN IN:       -UNSTABLE ANGINA       -CONGESTIVE HEART FAILURE       -MYOCARDITIS       -CHEST TRAUMA       -ARRYHTHMIAS       -LATE PRESENTING MYOCARDIAL INFARCTION       -COPD   CLINICAL FOLLOW-UP RECOMMENDED.   Lactic acid, plasma     Status: None   Collection Time: 08/17/14  1:13 PM  Result Value Ref Range   Lactic Acid, Venous 1.0 0.5 - 2.0 mmol/L  CBC with Differential     Status: None   Collection Time: 08/17/14  1:13 PM  Result Value Ref Range   WBC 7.8 4.0 - 10.5 K/uL   RBC 4.64 3.87 - 5.11 MIL/uL   Hemoglobin 14.4 12.0 - 15.0 g/dL   HCT 40.8 36.0 - 46.0 %   MCV 87.9 78.0 - 100.0 fL   MCH 31.0 26.0 - 34.0 pg   MCHC 35.3 30.0 - 36.0 g/dL    RDW 12.5 11.5 - 15.5 %   Platelets 196 150 - 400 K/uL   Neutrophils Relative % 68 43 - 77 %   Neutro Abs 5.3 1.7 - 7.7 K/uL   Lymphocytes Relative 26 12 - 46 %   Lymphs Abs 2.0 0.7 - 4.0 K/uL   Monocytes Relative 6 3 - 12 %   Monocytes Absolute 0.4 0.1 - 1.0 K/uL   Eosinophils Relative 0 0 - 5 %   Eosinophils Absolute 0.0 0.0 - 0.7 K/uL   Basophils Relative 0 0 - 1 %   Basophils Absolute 0.0 0.0 - 0.1 K/uL  Lactic acid, plasma     Status: None   Collection Time: 08/17/14  4:14 PM  Result Value Ref Range   Lactic Acid, Venous 0.7 0.5 - 2.0 mmol/L  Troponin I (q 6hr x 3)     Status: Abnormal   Collection Time: 08/17/14  8:12 PM  Result Value Ref Range   Troponin I 0.23 (H) <0.031 ng/mL    Comment:        PERSISTENTLY INCREASED TROPONIN VALUES IN THE RANGE OF 0.04-0.49 ng/mL CAN BE SEEN IN:       -UNSTABLE ANGINA       -CONGESTIVE HEART FAILURE       -MYOCARDITIS       -CHEST TRAUMA       -ARRYHTHMIAS       -LATE PRESENTING MYOCARDIAL INFARCTION       -COPD   CLINICAL FOLLOW-UP RECOMMENDED.   Urine rapid drug screen (hosp performed)     Status: Abnormal   Collection Time: 08/17/14  9:36 PM  Result Value Ref Range   Opiates NONE DETECTED NONE DETECTED   Cocaine NONE DETECTED NONE DETECTED   Benzodiazepines POSITIVE (A) NONE DETECTED   Amphetamines NONE DETECTED NONE DETECTED   Tetrahydrocannabinol NONE DETECTED NONE DETECTED   Barbiturates NONE DETECTED NONE DETECTED    Comment:        DRUG SCREEN FOR MEDICAL PURPOSES ONLY.  IF CONFIRMATION IS NEEDED FOR ANY PURPOSE, NOTIFY LAB WITHIN 5 DAYS.        LOWEST DETECTABLE LIMITS FOR URINE DRUG SCREEN Drug Class  Cutoff (ng/mL) Amphetamine      1000 Barbiturate      200 Benzodiazepine   259 Tricyclics       563 Opiates          300 Cocaine          300 THC              50   Troponin I (q 6hr x 3)     Status: Abnormal   Collection Time: 08/18/14  2:12 AM  Result Value Ref Range   Troponin I 0.21 (H) <0.031  ng/mL    Comment:        PERSISTENTLY INCREASED TROPONIN VALUES IN THE RANGE OF 0.04-0.49 ng/mL CAN BE SEEN IN:       -UNSTABLE ANGINA       -CONGESTIVE HEART FAILURE       -MYOCARDITIS       -CHEST TRAUMA       -ARRYHTHMIAS       -LATE PRESENTING MYOCARDIAL INFARCTION       -COPD   CLINICAL FOLLOW-UP RECOMMENDED.   Basic metabolic panel     Status: Abnormal   Collection Time: 08/18/14  2:12 AM  Result Value Ref Range   Sodium 134 (L) 135 - 145 mmol/L   Potassium 3.5 3.5 - 5.1 mmol/L   Chloride 102 101 - 111 mmol/L   CO2 25 22 - 32 mmol/L   Glucose, Bld 87 65 - 99 mg/dL   BUN 8 6 - 20 mg/dL   Creatinine, Ser 0.58 0.44 - 1.00 mg/dL   Calcium 8.8 (L) 8.9 - 10.3 mg/dL   GFR calc non Af Amer >60 >60 mL/min   GFR calc Af Amer >60 >60 mL/min    Comment: (NOTE) The eGFR has been calculated using the CKD EPI equation. This calculation has not been validated in all clinical situations. eGFR's persistently <60 mL/min signify possible Chronic Kidney Disease.    Anion gap 7 5 - 15  Troponin I (q 6hr x 3)     Status: Abnormal   Collection Time: 08/18/14  7:58 AM  Result Value Ref Range   Troponin I 0.20 (H) <0.031 ng/mL    Comment:        PERSISTENTLY INCREASED TROPONIN VALUES IN THE RANGE OF 0.04-0.49 ng/mL CAN BE SEEN IN:       -UNSTABLE ANGINA       -CONGESTIVE HEART FAILURE       -MYOCARDITIS       -CHEST TRAUMA       -ARRYHTHMIAS       -LATE PRESENTING MYOCARDIAL INFARCTION       -COPD   CLINICAL FOLLOW-UP RECOMMENDED.     ABGS No results for input(s): PHART, PO2ART, TCO2, HCO3 in the last 72 hours.  Invalid input(s): PCO2 CULTURES Recent Results (from the past 240 hour(s))  Urine culture     Status: None (Preliminary result)   Collection Time: 08/17/14  1:05 PM  Result Value Ref Range Status   Specimen Description URINE, CLEAN CATCH  Final   Special Requests NONE  Final   Culture   Final    NO GROWTH < 24 HOURS Performed at [facility]    Report  Status PENDING  Incomplete   Studies/Results: Dg Chest 2 View  08/17/2014   CLINICAL DATA:  Patient is confused and the heating strain distally.  EXAM: CHEST  2 VIEW  COMPARISON:  None.  FINDINGS: The heart size and mediastinal contours  are within normal limits. The aorta is slightly uncoiled. There is no focal infiltrate, pulmonary edema, or pleural effusion. Nipple shadows identified in the left lung base. There is scoliosis of spine.  IMPRESSION: No active cardiopulmonary disease.   Electronically Signed   By: Abelardo Diesel M.D.   On: 08/17/2014 13:31   Mr Brain Wo Contrast (neuro Protocol)  08/17/2014   CLINICAL DATA:  Altered mental status  EXAM: MRI HEAD WITHOUT CONTRAST  TECHNIQUE: Multiplanar, multiecho pulse sequences of the brain and surrounding structures were obtained without intravenous contrast.  COMPARISON:  CT head 07/03/2006  FINDINGS: Moderate atrophy.  Negative for hydrocephalus.  Negative for acute infarct.  Mild chronic microvascular ischemic change in the white matter and thalamus. Brainstem intact. Chronic encephalomalacia right inferior frontal lobe unchanged from the prior CT likely related to prior head trauma.  Negative for intracranial hemorrhage. Negative for mass or edema. No shift of the midline structures  Paranasal sinuses clear. Negative orbit. Pituitary not enlarged. Cervical medullary junction normal.  IMPRESSION: Atrophy and chronic microvascular ischemia.  No acute infarct.  Encephalomalacia right inferior frontal lobe consistent with prior head trauma.   Electronically Signed   By: Franchot Gallo M.D.   On: 08/17/2014 14:08    Medications:  Prior to Admission:  Prescriptions prior to admission  Medication Sig Dispense Refill Last Dose  . ALPRAZolam (XANAX) 0.25 MG tablet Take 1 tablet by mouth 2 (two) times daily as needed for anxiety.    08/16/2014  . amLODipine (NORVASC) 5 MG tablet Take 1 tablet by mouth daily.   08/16/2014  . ibuprofen (ADVIL,MOTRIN) 200 MG  tablet Take 200 mg by mouth every 6 (six) hours as needed for headache.   08/16/2014  . lisinopril (PRINIVIL,ZESTRIL) 20 MG tablet Take 1 tablet by mouth daily.   08/16/2014 at Unknown time  . torsemide (DEMADEX) 10 MG tablet Take 1 tablet by mouth daily.   08/16/2014   Scheduled: . amLODipine  5 mg Oral Daily  . aspirin EC  81 mg Oral Daily  . cefTRIAXone (ROCEPHIN)  IV  1 g Intravenous Q24H  . feeding supplement (ENSURE ENLIVE)  237 mL Oral BID BM  . heparin  5,000 Units Subcutaneous 3 times per day  . lisinopril  20 mg Oral Daily  . sodium chloride  3 mL Intravenous Q12H  . torsemide  10 mg Oral Daily   Continuous: . sodium chloride 100 mL/hr at 08/18/14 2126   WER:[id number] **OR** acetaminophen, ALPRAZolam, ondansetron **OR** ondansetron (ZOFRAN) IV, oxyCODONE  Assesment: She was admitted with dementia with behavioral disturbance probably related to urinary tract infection although we don't have culture results yet. Her confusion is better and she is back to her baseline confusion from dementia. She had elevated troponin that does not need any further workup. Principal Problem:   Elevated troponin Active Problems:   Altered mental state   Physical deconditioning   Hyponatremia   Essential hypertension   Dementia with behavioral disturbance   Altered mental status   Malnutrition of moderate degree    Plan: Discharge home today    LOS: 1 day   , L 08/19/2014, 9:39 AM

## 2014-09-20 ENCOUNTER — Encounter: Payer: Self-pay | Admitting: Adult Health

## 2014-09-20 ENCOUNTER — Encounter: Payer: BLUE CROSS/BLUE SHIELD | Admitting: Adult Health

## 2014-09-20 NOTE — Progress Notes (Signed)
Cardiology Office Note   Date:  09/20/2014   ID:  TAMSYN OWUSU, DOB [birth year], MRN [id number]  PCP:  Alonza Bogus, MD  Cardiologist:  Cloria Spring, NP   No chief complaint on file.     NO SHOW Phone: [phone]; Fax: [phone]

## 2014-10-12 ENCOUNTER — Inpatient Hospital Stay (HOSPITAL_COMMUNITY)
Admission: AD | Admit: 2014-10-12 | Discharge: 2014-10-15 | DRG: 481 | Disposition: A | Discharge status: Skilled Nursing Facility | Payer: Medicare Other | Attending: Pulmonary Disease | Admitting: Pulmonary Disease

## 2014-10-12 ENCOUNTER — Emergency Department (HOSPITAL_COMMUNITY): Payer: Medicare Other

## 2014-10-12 ENCOUNTER — Encounter (HOSPITAL_COMMUNITY): Payer: Self-pay | Admitting: *Deleted

## 2014-10-12 DIAGNOSIS — I10 Essential (primary) hypertension: Secondary | ICD-10-CM | POA: Diagnosis not present

## 2014-10-12 DIAGNOSIS — F0391 Unspecified dementia with behavioral disturbance: Secondary | ICD-10-CM | POA: Diagnosis not present

## 2014-10-12 DIAGNOSIS — S72141D Displaced intertrochanteric fracture of right femur, subsequent encounter for closed fracture with routine healing: Secondary | ICD-10-CM | POA: Diagnosis not present

## 2014-10-12 DIAGNOSIS — M1990 Unspecified osteoarthritis, unspecified site: Secondary | ICD-10-CM | POA: Diagnosis not present

## 2014-10-12 DIAGNOSIS — D62 Acute posthemorrhagic anemia: Secondary | ICD-10-CM | POA: Diagnosis not present

## 2014-10-12 DIAGNOSIS — M8589 Other specified disorders of bone density and structure, multiple sites: Secondary | ICD-10-CM | POA: Diagnosis not present

## 2014-10-12 DIAGNOSIS — W19XXXA Unspecified fall, initial encounter: Secondary | ICD-10-CM | POA: Diagnosis not present

## 2014-10-12 DIAGNOSIS — M6281 Muscle weakness (generalized): Secondary | ICD-10-CM | POA: Diagnosis not present

## 2014-10-12 DIAGNOSIS — R4182 Altered mental status, unspecified: Secondary | ICD-10-CM | POA: Diagnosis not present

## 2014-10-12 DIAGNOSIS — M6282 Rhabdomyolysis: Secondary | ICD-10-CM | POA: Diagnosis present

## 2014-10-12 DIAGNOSIS — T148XXA Other injury of unspecified body region, initial encounter: Secondary | ICD-10-CM

## 2014-10-12 DIAGNOSIS — Z853 Personal history of malignant neoplasm of breast: Secondary | ICD-10-CM | POA: Diagnosis not present

## 2014-10-12 DIAGNOSIS — R278 Other lack of coordination: Secondary | ICD-10-CM | POA: Diagnosis not present

## 2014-10-12 DIAGNOSIS — S72141A Displaced intertrochanteric fracture of right femur, initial encounter for closed fracture: Principal | ICD-10-CM

## 2014-10-12 DIAGNOSIS — S72001A Fracture of unspecified part of neck of right femur, initial encounter for closed fracture: Secondary | ICD-10-CM

## 2014-10-12 DIAGNOSIS — M25551 Pain in right hip: Secondary | ICD-10-CM | POA: Diagnosis not present

## 2014-10-12 DIAGNOSIS — S299XXA Unspecified injury of thorax, initial encounter: Secondary | ICD-10-CM | POA: Diagnosis not present

## 2014-10-12 DIAGNOSIS — R5381 Other malaise: Secondary | ICD-10-CM

## 2014-10-12 DIAGNOSIS — Y92009 Unspecified place in unspecified non-institutional (private) residence as the place of occurrence of the external cause: Secondary | ICD-10-CM

## 2014-10-12 DIAGNOSIS — F03918 Unspecified dementia, unspecified severity, with other behavioral disturbance: Secondary | ICD-10-CM

## 2014-10-12 DIAGNOSIS — M84459A Pathological fracture, hip, unspecified, initial encounter for fracture: Secondary | ICD-10-CM | POA: Diagnosis not present

## 2014-10-12 DIAGNOSIS — S72131D Displaced apophyseal fracture of right femur, subsequent encounter for closed fracture with routine healing: Secondary | ICD-10-CM | POA: Diagnosis not present

## 2014-10-12 DIAGNOSIS — T148 Other injury of unspecified body region: Secondary | ICD-10-CM | POA: Diagnosis not present

## 2014-10-12 DIAGNOSIS — E871 Hypo-osmolality and hyponatremia: Secondary | ICD-10-CM | POA: Diagnosis not present

## 2014-10-12 DIAGNOSIS — R262 Difficulty in walking, not elsewhere classified: Secondary | ICD-10-CM | POA: Diagnosis not present

## 2014-10-12 DIAGNOSIS — S72143A Displaced intertrochanteric fracture of unspecified femur, initial encounter for closed fracture: Secondary | ICD-10-CM | POA: Diagnosis present

## 2014-10-12 DIAGNOSIS — R41841 Cognitive communication deficit: Secondary | ICD-10-CM | POA: Diagnosis not present

## 2014-10-12 DIAGNOSIS — S72009A Fracture of unspecified part of neck of unspecified femur, initial encounter for closed fracture: Secondary | ICD-10-CM | POA: Diagnosis present

## 2014-10-12 DIAGNOSIS — F0390 Unspecified dementia without behavioral disturbance: Secondary | ICD-10-CM | POA: Diagnosis not present

## 2014-10-12 DIAGNOSIS — Z4789 Encounter for other orthopedic aftercare: Secondary | ICD-10-CM | POA: Diagnosis not present

## 2014-10-12 LAB — BASIC METABOLIC PANEL
ANION GAP: 6 (ref 5–15)
BUN: 12 mg/dL (ref 6–20)
CO2: 26 mmol/L (ref 22–32)
Calcium: 8.4 mg/dL — ABNORMAL LOW (ref 8.9–10.3)
Chloride: 96 mmol/L — ABNORMAL LOW (ref 101–111)
Creatinine, Ser: 0.6 mg/dL (ref 0.44–1.00)
Glucose, Bld: 119 mg/dL — ABNORMAL HIGH (ref 65–99)
Potassium: 4.4 mmol/L (ref 3.5–5.1)
SODIUM: 128 mmol/L — AB (ref 135–145)

## 2014-10-12 LAB — URINALYSIS, ROUTINE W REFLEX MICROSCOPIC
BILIRUBIN URINE: NEGATIVE
Glucose, UA: NEGATIVE mg/dL
Leukocytes, UA: NEGATIVE
NITRITE: NEGATIVE
Protein, ur: NEGATIVE mg/dL
SPECIFIC GRAVITY, URINE: 1.01 (ref 1.005–1.030)
Urobilinogen, UA: 0.2 mg/dL (ref 0.0–1.0)
pH: 7 (ref 5.0–8.0)

## 2014-10-12 LAB — CBC WITH DIFFERENTIAL/PLATELET
Basophils Absolute: 0 10*3/uL (ref 0.0–0.1)
Basophils Relative: 0 %
EOS PCT: 0 %
Eosinophils Absolute: 0 10*3/uL (ref 0.0–0.7)
HCT: 38.6 % (ref 36.0–46.0)
Hemoglobin: 13.8 g/dL (ref 12.0–15.0)
LYMPHS PCT: 5 %
Lymphs Abs: 0.6 10*3/uL — ABNORMAL LOW (ref 0.7–4.0)
MCH: 30.9 pg (ref 26.0–34.0)
MCHC: 35.8 g/dL (ref 30.0–36.0)
MCV: 86.4 fL (ref 78.0–100.0)
MONO ABS: 0.4 10*3/uL (ref 0.1–1.0)
Monocytes Relative: 3 %
Neutro Abs: 11.6 10*3/uL — ABNORMAL HIGH (ref 1.7–7.7)
Neutrophils Relative %: 92 %
PLATELETS: 180 10*3/uL (ref 150–400)
RBC: 4.47 MIL/uL (ref 3.87–5.11)
RDW: 12.4 % (ref 11.5–15.5)
WBC: 12.7 10*3/uL — ABNORMAL HIGH (ref 4.0–10.5)

## 2014-10-12 LAB — URINE MICROSCOPIC-ADD ON

## 2014-10-12 LAB — BRAIN NATRIURETIC PEPTIDE: B Natriuretic Peptide: 128 pg/mL — ABNORMAL HIGH (ref 0.0–100.0)

## 2014-10-12 LAB — PROTIME-INR
INR: 1.1 (ref 0.00–1.49)
PROTHROMBIN TIME: 14.4 s (ref 11.6–15.2)

## 2014-10-12 LAB — CK: Total CK: 1216 U/L — ABNORMAL HIGH (ref 38–234)

## 2014-10-12 MED ORDER — MORPHINE SULFATE (PF) 2 MG/ML IV SOLN: 2.0000 mg | INTRAVENOUS | Status: DC | PRN

## 2014-10-12 MED ORDER — MORPHINE SULFATE (PF) 4 MG/ML IV SOLN: 4.0000 mg | INTRAVENOUS | Status: DC | PRN

## 2014-10-12 MED ORDER — ONDANSETRON HCL 4 MG/2ML IJ SOLN
4.0000 mg | Freq: Once | INTRAMUSCULAR | Status: AC
  Administered 2014-10-12: 4 mg via INTRAVENOUS
  Filled 2014-10-12: qty 2

## 2014-10-12 MED ORDER — SODIUM CHLORIDE 0.9 % IV SOLN
Freq: Once | INTRAVENOUS | Status: AC
  Administered 2014-10-12: 21:00:00 via INTRAVENOUS

## 2014-10-12 NOTE — ED Provider Notes (Addendum)
CSN: [id number]     Arrival date & time 10/12/14  1937 History   First MD Initiated Contact with Patient 10/12/14 2000     Chief Complaint  Patient presents with  . Fall      HPI  Patient presents for evaluation via EMS accompanied by her daughter. Patient lives alone. Lives directly next door to her daughter who does most of her care. She is apparent at this point have breakfast. Daughter came home at 4:15. Her mother was on the floor complaining of hip pain. Probably been there all day. Urinated on herself. She had a great of pain attempting to move her. EMS was summoned. His rotation and shortening of her right leg and pain in the right hip. No strike to the head. No complaint of headache. Daughter states she is mentating at her baseline. Has history of mild dementia.  Past Medical History  Diagnosis Date  . Cancer     breast  . Hypertension   . Confusion   . Dementia    Past Surgical History  Procedure Laterality Date  . Abdominal hysterectomy     Family History  Problem Relation Age of Onset  . Aneurysm Mother    Social History  Substance Use Topics  . Smoking status: Never Smoker   . Smokeless tobacco: None  . Alcohol Use: No   OB History    No data available     Review of Systems  Unable to perform ROS: Dementia      Allergies  Review of patient's allergies indicates no known allergies.  Home Medications   Prior to Admission medications   Medication Sig Start Date End Date Taking? Authorizing Provider  ALPRAZolam Duanne Moron) 0.25 MG tablet Take 1 tablet by mouth 2 (two) times daily as needed for anxiety.  07/08/14  Yes Historical Provider, MD  amLODipine (NORVASC) 5 MG tablet Take 1 tablet by mouth daily. 07/17/14  Yes Historical Provider, MD  aspirin EC 81 MG EC tablet Take 1 tablet (81 mg total) by mouth daily. 08/19/14  Yes Sinda Du, MD  feeding supplement, ENSURE ENLIVE, (ENSURE ENLIVE) LIQD Take 237 mLs by mouth 2 (two) times daily between meals.  08/19/14  Yes Sinda Du, MD  Lactobacillus [facility] ACIDOPHILUS PO) Take 1 capsule by mouth daily.   Yes Historical Provider, MD  lisinopril (PRINIVIL,ZESTRIL) 20 MG tablet Take 1 tablet by mouth daily. 07/17/14  Yes Historical Provider, MD  naproxen sodium (ALEVE) 220 MG tablet Take 220 mg by mouth daily as needed (for pain).   Yes Historical Provider, MD  traZODone (DESYREL) 50 MG tablet Take 0.5 tablets (25 mg total) by mouth at bedtime. 08/19/14  Yes Sinda Du, MD  cefUROXime (CEFTIN) 250 MG tablet Take 1 tablet (250 mg total) by mouth 2 (two) times daily with a meal. Patient not taking: Reported on 10/12/2014 08/19/14   Sinda Du, MD  torsemide (DEMADEX) 10 MG tablet Take 1 tablet by mouth daily. 05/10/14   Historical Provider, MD   BP 128/61 mmHg  Pulse 91  Temp(Src) 98 F (36.7 C) (Oral)  Resp 20  Ht 5\' 4"  (1.626 m)  Wt 107 lb (48.535 kg)  BMI 18.36 kg/m2  SpO2 98% Physical Exam  Constitutional: She appears well-developed and well-nourished. No distress.  HENT:  Head: Normocephalic.  Eyes: Conjunctivae are normal. Pupils are equal, round, and reactive to light. No scleral icterus.  Neck: Normal range of motion. Neck supple. No thyromegaly present.  Cardiovascular: Normal rate and regular rhythm.  Exam reveals no gallop and no friction rub.   No murmur heard. Pulmonary/Chest: Effort normal and breath sounds normal. No respiratory distress. She has no wheezes. She has no rales.  Abdominal: Soft. Bowel sounds are normal. She exhibits no distension. There is no tenderness. There is no rebound.  Musculoskeletal:       Legs: Neurological: She is alert.  Skin: Skin is warm and dry. No rash noted.  Psychiatric: She has a normal mood and affect. Her behavior is normal.    ED Course  Procedures (including critical care time) Labs Review Labs Reviewed  URINE CULTURE  CBC WITH DIFFERENTIAL/PLATELET  CK  BRAIN NATRIURETIC PEPTIDE  PROTIME-INR  URINALYSIS, ROUTINE W  REFLEX MICROSCOPIC (NOT AT [facility])  TYPE AND SCREEN    Imaging Review Dg Chest 1 View  10/12/2014   CLINICAL DATA:  81 year old female with fall and hip pain  EXAM: CHEST  1 VIEW  COMPARISON:  Radiograph dated 08/17/2014  FINDINGS: Single-view of the chest demonstrates emphysematous changes of the lungs. No focal consolidation, pleural effusion, or pneumothorax. Stable cardiac silhouette. A 6 mm nodular appearing density in the right hilar region appears similar the prior study and likely represents a vessel on end. Right axillary surgical clips noted. Osteopenia with degenerative changes of the spine. No acute fracture.  IMPRESSION: No active disease.   Electronically Signed   By: Anner Crete M.D.   On: 10/12/2014 21:25   Dg Hip Unilat With Pelvis 2-3 Views Right  10/12/2014   CLINICAL DATA:  81 year old female with right hip pain after falling  EXAM: DG HIP (WITH OR WITHOUT PELVIS) 2-3V RIGHT  COMPARISON:  None.  FINDINGS: Mildly comminuted right intertrochanteric fracture with varus angulation of the femoral neck. The femoral head remains located. Nonspecific sclerotic density in the subchondral aspect of the superior right femoral head. The bones appear osteopenic. Degenerative disc disease present in the lower lumbar spine.  IMPRESSION: 1. Acute mildly comminuted right intertrochanteric fracture with resultant varus angulation of the femoral neck. 2. Nonspecific subchondral sclerosis in the superior aspect of the right femoral head. This is favored to represent reactive subchondral sclerosis in the setting of osteoarthritis. A sclerotic metastatic lesion is considered less likely but difficult to exclude entirely. 3. Osteopenia 4. Lower lumbar degenerative disc disease.   Electronically Signed   By: Jacqulynn Cadet M.D.   On: 10/12/2014 21:22   I have personally reviewed and evaluated these images and lab results as part of my medical decision-making.   EKG Interpretation None      MDM    Final diagnoses:  Intertrochanteric fracture of right hip, closed, initial encounter    X-rays show comminuted intertrochanteric hip fracture. No other signs of trauma. No sinus tract to head. Does have abrasion versus acute with left hip. Other labs pending. Plan will be admission to hospitalist, orthopedic consultation regarding hip ORIF.  Stress with Dr. Aline Brochure. We'll plan on seeing the patient tomorrow for operative repair. Discussed with Dr. Truman Hayward. Plan on admitting.  CK 1200.    Tanna Furry, MD 10/12/14 2129  Tanna Furry, MD 10/12/14 2211

## 2014-10-12 NOTE — ED Notes (Signed)
Pt brought in by [facility] for c/o fall; pt is having pain to right hip; pt has shortening and rotation to right leg; pt was given morphine 6mg  en route to hospital and pt states she felt better

## 2014-10-12 NOTE — ED Notes (Signed)
Report given to receiving RN. Pt ready for transfer.

## 2014-10-12 NOTE — H&P (Signed)
[facility] History and Physical  NELLIE PESTER HQI:[id number] DOB: 1923/07/15    PCP:   Alonza Bogus, MD   Chief Complaint: fell and has right Fx hip with mild rhabdo.   HPI: Sara Bowman is an 81 y.o. female with hx of mild to mod dementia, lives at home, with her daughter living nearby, hx of HTN, breast cancer, fell around noon yesterday, and brought to the ER, where her x ray showed comminuted Fx of the right comminuted intertroch Fx with mild varus deformity.  She was also found to have a mild elevation of her CPK (1200), with normal renal fx. Her Na was noted to be 128, and her Hb was 13 grams per dL.  EKG was unremarkable and her CXR was clear.  EDP spoke with Dr Aline Brochure of orthopedics, and hospitalist was asked to admit her for further surgical repair.   She has no CP, SOB, abdominal pain, or any other symptomology.   Rewiew of Systems:  Constitutional: Negative for malaise, fever and chills. No significant weight loss or weight gain Eyes: Negative for eye pain, redness and discharge, diplopia, visual changes, or flashes of light. ENMT: Negative for ear pain, hoarseness, nasal congestion, sinus pressure and sore throat. No headaches; tinnitus, drooling, or problem swallowing. Cardiovascular: Negative for chest pain, palpitations, diaphoresis, dyspnea and peripheral edema. ; No orthopnea, PND Respiratory: Negative for cough, hemoptysis, wheezing and stridor. No pleuritic chestpain. Gastrointestinal: Negative for nausea, vomiting, diarrhea, constipation, abdominal pain, melena, blood in stool, hematemesis, jaundice and rectal bleeding.    Genitourinary: Negative for frequency, dysuria, incontinence,flank pain and hematuria; Musculoskeletal: Negative for back pain and neck pain.  Skin: . Negative for pruritus, rash, abrasions, bruising and skin lesion.; ulcerations Neuro: Negative for headache, lightheadedness and neck stiffness. Negative for weakness, altered level of  consciousness , altered mental status, extremity weakness, burning feet, involuntary movement, seizure and syncope.  Psych: negative for anxiety, depression, insomnia, tearfulness, panic attacks, hallucinations, paranoia, suicidal or homicidal ideation    Past Medical History  Diagnosis Date  . Cancer     breast  . Hypertension   . Confusion   . Dementia     Past Surgical History  Procedure Laterality Date  . Abdominal hysterectomy      Medications:  HOME MEDS: Prior to Admission medications   Medication Sig Start Date End Date Taking? Authorizing Provider  ALPRAZolam Duanne Moron) 0.25 MG tablet Take 1 tablet by mouth 2 (two) times daily as needed for anxiety.  07/08/14  Yes Historical Provider, MD  amLODipine (NORVASC) 5 MG tablet Take 1 tablet by mouth daily. 07/17/14  Yes Historical Provider, MD  aspirin EC 81 MG EC tablet Take 1 tablet (81 mg total) by mouth daily. 08/19/14  Yes Sinda Du, MD  feeding supplement, ENSURE ENLIVE, (ENSURE ENLIVE) LIQD Take 237 mLs by mouth 2 (two) times daily between meals. 08/19/14  Yes Sinda Du, MD  Lactobacillus [facility] ACIDOPHILUS PO) Take 1 capsule by mouth daily.   Yes Historical Provider, MD  lisinopril (PRINIVIL,ZESTRIL) 20 MG tablet Take 1 tablet by mouth daily. 07/17/14  Yes Historical Provider, MD  naproxen sodium (ALEVE) 220 MG tablet Take 220 mg by mouth daily as needed (for pain).   Yes Historical Provider, MD  traZODone (DESYREL) 50 MG tablet Take 0.5 tablets (25 mg total) by mouth at bedtime. 08/19/14  Yes Sinda Du, MD  cefUROXime (CEFTIN) 250 MG tablet Take 1 tablet (250 mg total) by mouth 2 (two) times daily with a  meal. Patient not taking: Reported on 10/12/2014 08/19/14   Sinda Du, MD  torsemide (DEMADEX) 10 MG tablet Take 1 tablet by mouth daily. 05/10/14   Historical Provider, MD     Allergies:  No Known Allergies  Social History:   reports that she has never smoked. She does not have any smokeless tobacco  history on file. She reports that she does not drink alcohol or use illicit drugs.  Family History: Family History  Problem Relation Age of Onset  . Aneurysm Mother      Physical Exam: Filed Vitals:   10/12/14 1943 10/12/14 2000 10/12/14 2030 10/12/14 2156  BP: 168/76 142/78 128/61 135/75  Pulse: 98 93 91 99  Temp: 98 F (36.7 C)     TempSrc: Oral     Resp: 20   17  Height: 5\' 4"  (1.626 m)     Weight: 48.535 kg (107 lb)     SpO2: 100% 97% 98% 99%   Blood pressure 135/75, pulse 99, temperature 98 F (36.7 C), temperature source Oral, resp. rate 17, height 5\' 4"  (1.626 m), weight 48.535 kg (107 lb), SpO2 99 %.  GEN:  Pleasant  patient lying in the stretcher in no acute distress; cooperative with exam. PSYCH:  alert and oriented x4; does not appear anxious or depressed; affect is appropriate. HEENT: Mucous membranes pink and anicteric; PERRLA; EOM intact; no cervical lymphadenopathy nor thyromegaly or carotid bruit; no JVD; There were no stridor. Neck is very supple. Breasts:: Not examined CHEST WALL: No tenderness CHEST: Normal respiration, clear to auscultation bilaterally.  HEART: Regular rate and rhythm.  There are no murmur, rub, or gallops.   BACK: No kyphosis or scoliosis; no CVA tenderness ABDOMEN: soft and non-tender; no masses, no organomegaly, normal abdominal bowel sounds; no pannus; no intertriginous candida. There is no rebound and no distention. Rectal Exam: Not done EXTREMITIES: No bone or joint deformity; age-appropriate arthropathy of the hands and knees; no edema; no ulcerations.  There is no calf tenderness. Did not exam her right hip.  Genitalia: not examined PULSES: 2+ and symmetric SKIN: Normal hydration no rash or ulceration CNS: Cranial nerves 2-12 grossly intact no focal lateralizing neurologic deficit.  Speech is fluent; uvula elevated with phonation, facial symmetry and tongue midline. DTR are normal bilaterally, cerebella exam is intact, barbinski is  negative and strengths are equaled bilaterally.  No sensory loss.   Labs on Admission:  Basic Metabolic Panel:  Recent Labs Lab 10/12/14 2124  NA 128*  K 4.4  CL 96*  CO2 26  GLUCOSE 119*  BUN 12  CREATININE 0.60  CALCIUM 8.4*   CBC:  Recent Labs Lab 10/12/14 2124  WBC 12.7*  NEUTROABS 11.6*  HGB 13.8  HCT 38.6  MCV 86.4  PLT 180   Cardiac Enzymes:  Recent Labs Lab 10/12/14 2124  CKTOTAL 1216*    Radiological Exams on Admission: Dg Chest 1 View  10/12/2014   CLINICAL DATA:  81 year old female with fall and hip pain  EXAM: CHEST  1 VIEW  COMPARISON:  Radiograph dated 08/17/2014  FINDINGS: Single-view of the chest demonstrates emphysematous changes of the lungs. No focal consolidation, pleural effusion, or pneumothorax. Stable cardiac silhouette. A 6 mm nodular appearing density in the right hilar region appears similar the prior study and likely represents a vessel on end. Right axillary surgical clips noted. Osteopenia with degenerative changes of the spine. No acute fracture.  IMPRESSION: No active disease.   Electronically Signed   By: Milas Hock  Radparvar M.D.   On: 10/12/2014 21:25   Dg Hip Unilat With Pelvis 2-3 Views Right  10/12/2014   CLINICAL DATA:  81 year old female with right hip pain after falling  EXAM: DG HIP (WITH OR WITHOUT PELVIS) 2-3V RIGHT  COMPARISON:  None.  FINDINGS: Mildly comminuted right intertrochanteric fracture with varus angulation of the femoral neck. The femoral head remains located. Nonspecific sclerotic density in the subchondral aspect of the superior right femoral head. The bones appear osteopenic. Degenerative disc disease present in the lower lumbar spine.  IMPRESSION: 1. Acute mildly comminuted right intertrochanteric fracture with resultant varus angulation of the femoral neck. 2. Nonspecific subchondral sclerosis in the superior aspect of the right femoral head. This is favored to represent reactive subchondral sclerosis in the setting of  osteoarthritis. A sclerotic metastatic lesion is considered less likely but difficult to exclude entirely. 3. Osteopenia 4. Lower lumbar degenerative disc disease.   Electronically Signed   By: Jacqulynn Cadet M.D.   On: 10/12/2014 21:22    EKG: Independently reviewed.    Assessment/Plan Present on Admission:  . Hip fracture . Hip fracture, right . Essential hypertension . Dementia with behavioral disturbance . Physical deconditioning . Rhabdomyolysis  PLAN:  Will admit her for mechanical fall, resulting in mild rhabdomyolysis and comminuted right intertrochanteric Fx with varus deformity.  Accepting her moderate perioperative risk, she should proceed with ORIF.  For her mild rhabdo, will follow her Cr and give gentle IVF.  Dr Aline Brochure of orthopedics will see her in the am.  She is given heparin SQ prophylaxis, NPO, and IV pain meds.  She is stable, and full code (confirmed tonight), and will be admitted to Dr Miguel Rota service.  Thank you and Good day.   Other plans as per orders.  Code Status: FULL Haskel Khan, MD. [facility] Pager [phone] 7pm to 7am.  10/12/2014, 10:23 PM

## 2014-10-13 ENCOUNTER — Encounter (HOSPITAL_COMMUNITY)
Admission: AD | Disposition: A | Discharge status: Skilled Nursing Facility | Payer: Self-pay | Source: Home / Self Care | Attending: Pulmonary Disease

## 2014-10-13 ENCOUNTER — Inpatient Hospital Stay (HOSPITAL_COMMUNITY): Payer: Medicare Other | Admitting: Anesthesiology

## 2014-10-13 ENCOUNTER — Encounter (HOSPITAL_COMMUNITY): Payer: Self-pay | Admitting: *Deleted

## 2014-10-13 ENCOUNTER — Inpatient Hospital Stay (HOSPITAL_COMMUNITY): Payer: Medicare Other

## 2014-10-13 DIAGNOSIS — S72141A Displaced intertrochanteric fracture of right femur, initial encounter for closed fracture: Principal | ICD-10-CM

## 2014-10-13 DIAGNOSIS — S72143A Displaced intertrochanteric fracture of unspecified femur, initial encounter for closed fracture: Secondary | ICD-10-CM | POA: Diagnosis present

## 2014-10-13 HISTORY — PX: INTRAMEDULLARY (IM) NAIL INTERTROCHANTERIC: SHX5875

## 2014-10-13 LAB — CBC
HCT: 37.9 % (ref 36.0–46.0)
HEMOGLOBIN: 13.3 g/dL (ref 12.0–15.0)
MCH: 30.7 pg (ref 26.0–34.0)
MCHC: 35.1 g/dL (ref 30.0–36.0)
MCV: 87.5 fL (ref 78.0–100.0)
PLATELETS: 199 10*3/uL (ref 150–400)
RBC: 4.33 MIL/uL (ref 3.87–5.11)
RDW: 12.6 % (ref 11.5–15.5)
WBC: 9.1 10*3/uL (ref 4.0–10.5)

## 2014-10-13 LAB — BASIC METABOLIC PANEL
ANION GAP: 6 (ref 5–15)
BUN: 12 mg/dL (ref 6–20)
CALCIUM: 8.5 mg/dL — AB (ref 8.9–10.3)
CO2: 25 mmol/L (ref 22–32)
CREATININE: 0.51 mg/dL (ref 0.44–1.00)
Chloride: 99 mmol/L — ABNORMAL LOW (ref 101–111)
Glucose, Bld: 98 mg/dL (ref 65–99)
Potassium: 4.2 mmol/L (ref 3.5–5.1)
SODIUM: 130 mmol/L — AB (ref 135–145)

## 2014-10-13 LAB — CK: CK TOTAL: 1016 U/L — AB (ref 38–234)

## 2014-10-13 LAB — SURGICAL PCR SCREEN
MRSA, PCR: NEGATIVE
Staphylococcus aureus: NEGATIVE

## 2014-10-13 LAB — PREPARE RBC (CROSSMATCH)

## 2014-10-13 LAB — ABO/RH: ABO/RH(D): O POS

## 2014-10-13 SURGERY — FIXATION, FRACTURE, INTERTROCHANTERIC, WITH INTRAMEDULLARY ROD
Anesthesia: Spinal | Laterality: Right

## 2014-10-13 MED ORDER — CEFAZOLIN SODIUM-DEXTROSE 2-3 GM-% IV SOLR
2.0000 g | Freq: Four times a day (QID) | INTRAVENOUS | Status: AC
  Administered 2014-10-13: 2 g via INTRAVENOUS
  Filled 2014-10-13: qty 50

## 2014-10-13 MED ORDER — ACETAMINOPHEN 325 MG PO TABS: 650.0000 mg | ORAL_TABLET | Freq: Four times a day (QID) | ORAL | Status: DC | PRN

## 2014-10-13 MED ORDER — ACETAMINOPHEN 650 MG RE SUPP: 650.0000 mg | Freq: Four times a day (QID) | RECTAL | Status: DC | PRN

## 2014-10-13 MED ORDER — CEFAZOLIN SODIUM-DEXTROSE 2-3 GM-% IV SOLR
INTRAVENOUS | Status: AC
  Filled 2014-10-13: qty 50

## 2014-10-13 MED ORDER — METOCLOPRAMIDE HCL 10 MG PO TABS: 5.0000 mg | ORAL_TABLET | Freq: Three times a day (TID) | ORAL | Status: DC | PRN

## 2014-10-13 MED ORDER — FENTANYL CITRATE (PF) 100 MCG/2ML IJ SOLN
25.0000 ug | Freq: Once | INTRAMUSCULAR | Status: AC
  Administered 2014-10-13: 25 ug via INTRAVENOUS

## 2014-10-13 MED ORDER — ALUM & MAG HYDROXIDE-SIMETH 200-200-20 MG/5ML PO SUSP: 30.0000 mL | ORAL | Status: DC | PRN

## 2014-10-13 MED ORDER — FENTANYL CITRATE (PF) 100 MCG/2ML IJ SOLN
INTRAMUSCULAR | Status: AC
  Filled 2014-10-13: qty 4

## 2014-10-13 MED ORDER — SODIUM CHLORIDE 0.9 % IV SOLN
INTRAVENOUS | Status: DC | PRN
  Administered 2014-10-13 (×2): via INTRAVENOUS

## 2014-10-13 MED ORDER — MIDAZOLAM HCL 2 MG/2ML IJ SOLN
INTRAMUSCULAR | Status: AC
  Filled 2014-10-13: qty 2

## 2014-10-13 MED ORDER — ONDANSETRON HCL 4 MG/2ML IJ SOLN
4.0000 mg | Freq: Four times a day (QID) | INTRAMUSCULAR | Status: DC | PRN
  Administered 2014-10-14: 4 mg via INTRAVENOUS
  Filled 2014-10-13: qty 2

## 2014-10-13 MED ORDER — BUPIVACAINE IN DEXTROSE 0.75-8.25 % IT SOLN
INTRATHECAL | Status: DC | PRN
  Administered 2014-10-13: 13 mg via INTRATHECAL

## 2014-10-13 MED ORDER — SODIUM CHLORIDE 0.9 % IR SOLN
Status: DC | PRN
Start: 2014-10-13 — End: 2014-10-13
  Administered 2014-10-13: 1000 mL

## 2014-10-13 MED ORDER — FLORAJEN ACIDOPHILUS PO CAPS: ORAL_CAPSULE | Freq: Every day | ORAL | Status: DC

## 2014-10-13 MED ORDER — MENTHOL 3 MG MT LOZG: 1.0000 | LOZENGE | OROMUCOSAL | Status: DC | PRN

## 2014-10-13 MED ORDER — MIDAZOLAM HCL 2 MG/2ML IJ SOLN
INTRAMUSCULAR | Status: AC
  Filled 2014-10-13: qty 4

## 2014-10-13 MED ORDER — PROPOFOL 10 MG/ML IV BOLUS
INTRAVENOUS | Status: AC
  Filled 2014-10-13: qty 20

## 2014-10-13 MED ORDER — EPHEDRINE SULFATE 50 MG/ML IJ SOLN
INTRAMUSCULAR | Status: AC
  Filled 2014-10-13: qty 1

## 2014-10-13 MED ORDER — CHLORHEXIDINE GLUCONATE 4 % EX LIQD: 60.0000 mL | Freq: Once | CUTANEOUS | Status: DC

## 2014-10-13 MED ORDER — TRAZODONE HCL 50 MG PO TABS
25.0000 mg | ORAL_TABLET | Freq: Every day | ORAL | Status: DC
  Administered 2014-10-13 – 2014-10-14 (×2): 25 mg via ORAL
  Filled 2014-10-13 (×2): qty 1

## 2014-10-13 MED ORDER — POLYETHYLENE GLYCOL 3350 17 G PO PACK
17.0000 g | PACK | Freq: Every day | ORAL | Status: DC
Start: 2014-10-13 — End: 2014-10-15
  Administered 2014-10-13 – 2014-10-15 (×3): 17 g via ORAL
  Filled 2014-10-13 (×3): qty 1

## 2014-10-13 MED ORDER — HEPARIN SODIUM (PORCINE) 5000 UNIT/ML IJ SOLN
5000.0000 [IU] | Freq: Three times a day (TID) | INTRAMUSCULAR | Status: DC
  Administered 2014-10-13 – 2014-10-15 (×7): 5000 [IU] via SUBCUTANEOUS
  Filled 2014-10-13 (×8): qty 1

## 2014-10-13 MED ORDER — HYDROCODONE-ACETAMINOPHEN 5-325 MG PO TABS
1.0000 | ORAL_TABLET | ORAL | Status: DC | PRN
  Administered 2014-10-13: 1 via ORAL
  Filled 2014-10-13: qty 1

## 2014-10-13 MED ORDER — PHENOL 1.4 % MT LIQD: 1.0000 | OROMUCOSAL | Status: DC | PRN

## 2014-10-13 MED ORDER — INFLUENZA VAC SPLIT QUAD 0.5 ML IM SUSY
0.5000 mL | PREFILLED_SYRINGE | INTRAMUSCULAR | Status: AC
  Administered 2014-10-14: 0.5 mL via INTRAMUSCULAR

## 2014-10-13 MED ORDER — ONDANSETRON HCL 4 MG PO TABS: 4.0000 mg | ORAL_TABLET | Freq: Four times a day (QID) | ORAL | Status: DC | PRN

## 2014-10-13 MED ORDER — BUPIVACAINE-EPINEPHRINE (PF) 0.5% -1:200000 IJ SOLN
INTRAMUSCULAR | Status: AC
Start: 2014-10-13 — End: 2014-10-13
  Filled 2014-10-13: qty 60

## 2014-10-13 MED ORDER — SODIUM CHLORIDE 0.9 % IV SOLN
INTRAVENOUS | Status: DC
  Administered 2014-10-13: 10:00:00 via INTRAVENOUS

## 2014-10-13 MED ORDER — FENTANYL CITRATE (PF) 100 MCG/2ML IJ SOLN
INTRAMUSCULAR | Status: DC | PRN
  Administered 2014-10-13: 25 ug via INTRATHECAL

## 2014-10-13 MED ORDER — METOCLOPRAMIDE HCL 5 MG/ML IJ SOLN: 5.0000 mg | Freq: Three times a day (TID) | INTRAMUSCULAR | Status: DC | PRN

## 2014-10-13 MED ORDER — MIDAZOLAM HCL 5 MG/5ML IJ SOLN
INTRAMUSCULAR | Status: DC | PRN
  Administered 2014-10-13 (×2): 0.5 mg via INTRAVENOUS

## 2014-10-13 MED ORDER — LACTATED RINGERS IV SOLN: INTRAVENOUS | Status: DC

## 2014-10-13 MED ORDER — RISAQUAD PO CAPS
2.0000 | ORAL_CAPSULE | Freq: Every day | ORAL | Status: DC
  Administered 2014-10-14 – 2014-10-15 (×2): 2 via ORAL
  Filled 2014-10-13 (×2): qty 2

## 2014-10-13 MED ORDER — MIDAZOLAM HCL 2 MG/2ML IJ SOLN
1.0000 mg | INTRAMUSCULAR | Status: DC | PRN
  Administered 2014-10-13: 2 mg via INTRAVENOUS

## 2014-10-13 MED ORDER — FENTANYL CITRATE (PF) 100 MCG/2ML IJ SOLN: 25.0000 ug | INTRAMUSCULAR | Status: DC | PRN

## 2014-10-13 MED ORDER — PROPOFOL INFUSION 10 MG/ML OPTIME
INTRAVENOUS | Status: DC | PRN
  Administered 2014-10-13: 50 ug/kg/min via INTRAVENOUS

## 2014-10-13 MED ORDER — MORPHINE SULFATE (PF) 2 MG/ML IV SOLN
2.0000 mg | INTRAVENOUS | Status: DC | PRN
  Administered 2014-10-13 (×2): 2 mg via INTRAVENOUS
  Filled 2014-10-13 (×2): qty 1

## 2014-10-13 MED ORDER — SODIUM CHLORIDE 0.9 % IJ SOLN
INTRAMUSCULAR | Status: AC
  Filled 2014-10-13: qty 10

## 2014-10-13 MED ORDER — BUPIVACAINE-EPINEPHRINE (PF) 0.5% -1:200000 IJ SOLN
INTRAMUSCULAR | Status: DC | PRN
  Administered 2014-10-13: 30 mL

## 2014-10-13 MED ORDER — BACID PO TABS
2.0000 | ORAL_TABLET | Freq: Every day | ORAL | Status: DC
  Filled 2014-10-13: qty 2

## 2014-10-13 MED ORDER — AMLODIPINE BESYLATE 5 MG PO TABS
5.0000 mg | ORAL_TABLET | Freq: Every day | ORAL | Status: DC
  Administered 2014-10-14: 5 mg via ORAL
  Filled 2014-10-13 (×2): qty 1

## 2014-10-13 MED ORDER — TRAMADOL HCL 50 MG PO TABS
50.0000 mg | ORAL_TABLET | Freq: Four times a day (QID) | ORAL | Status: DC
Start: 2014-10-13 — End: 2014-10-15
  Administered 2014-10-13 – 2014-10-15 (×9): 50 mg via ORAL
  Filled 2014-10-13 (×9): qty 1

## 2014-10-13 MED ORDER — ASPIRIN EC 81 MG PO TBEC: 81.0000 mg | DELAYED_RELEASE_TABLET | Freq: Every day | ORAL | Status: DC

## 2014-10-13 MED ORDER — LIDOCAINE HCL (PF) 1 % IJ SOLN
INTRAMUSCULAR | Status: AC
  Filled 2014-10-13: qty 5

## 2014-10-13 MED ORDER — FENTANYL CITRATE (PF) 100 MCG/2ML IJ SOLN
INTRAMUSCULAR | Status: AC
  Filled 2014-10-13: qty 2

## 2014-10-13 MED ORDER — ONDANSETRON HCL 4 MG/2ML IJ SOLN: 4.0000 mg | Freq: Once | INTRAMUSCULAR | Status: DC | PRN

## 2014-10-13 MED ORDER — CEFAZOLIN SODIUM-DEXTROSE 2-3 GM-% IV SOLR
2.0000 g | INTRAVENOUS | Status: AC
  Administered 2014-10-13: 2 g via INTRAVENOUS

## 2014-10-13 MED ORDER — ASPIRIN EC 325 MG PO TBEC
325.0000 mg | DELAYED_RELEASE_TABLET | Freq: Every day | ORAL | Status: DC
  Administered 2014-10-14 – 2014-10-15 (×2): 325 mg via ORAL
  Filled 2014-10-13 (×2): qty 1

## 2014-10-13 MED ORDER — PHENYLEPHRINE 40 MCG/ML (10ML) SYRINGE FOR IV PUSH (FOR BLOOD PRESSURE SUPPORT)
PREFILLED_SYRINGE | INTRAVENOUS | Status: AC
  Filled 2014-10-13: qty 10

## 2014-10-13 MED ORDER — LISINOPRIL 10 MG PO TABS
20.0000 mg | ORAL_TABLET | Freq: Every day | ORAL | Status: DC
  Administered 2014-10-14: 20 mg via ORAL
  Filled 2014-10-13 (×2): qty 2

## 2014-10-13 MED ORDER — LACTATED RINGERS IV SOLN: INTRAVENOUS | Status: DC | PRN

## 2014-10-13 SURGICAL SUPPLY — 52 items
BAG HAMPER (MISCELLANEOUS) ×3 IMPLANT
BIT DRILL AO GAMMA 4.2X300 (BIT) ×3 IMPLANT
BLADE HEX COATED 2.75 (ELECTRODE) ×3 IMPLANT
BLADE SURG SZ10 CARB STEEL (BLADE) ×6 IMPLANT
BNDG GAUZE ELAST 4 BULKY (GAUZE/BANDAGES/DRESSINGS) ×3 IMPLANT
CHLORAPREP W/TINT 26ML (MISCELLANEOUS) ×3 IMPLANT
CLOTH BEACON ORANGE TIMEOUT ST (SAFETY) ×3 IMPLANT
COVER LIGHT HANDLE STERIS (MISCELLANEOUS) ×6 IMPLANT
COVER MAYO STAND XLG (DRAPE) ×3 IMPLANT
DECANTER SPIKE VIAL GLASS SM (MISCELLANEOUS) ×6 IMPLANT
DRAPE STERI IOBAN 125X83 (DRAPES) ×3 IMPLANT
DRSG AQUACEL AG ADV 3.5X10 (GAUZE/BANDAGES/DRESSINGS) ×3 IMPLANT
DRSG MEPILEX BORDER 4X12 (GAUZE/BANDAGES/DRESSINGS) ×3 IMPLANT
ELECT REM PT RETURN 9FT ADLT (ELECTROSURGICAL) ×3
ELECTRODE REM PT RTRN 9FT ADLT (ELECTROSURGICAL) ×1 IMPLANT
GLOVE BIO SURGEON STRL SZ7 (GLOVE) ×8 IMPLANT
GLOVE BIOGEL M 7.0 STRL (GLOVE) ×4 IMPLANT
GLOVE SKINSENSE NS SZ8.0 LF (GLOVE) ×2
GLOVE SKINSENSE STRL SZ8.0 LF (GLOVE) ×1 IMPLANT
GLOVE SS N UNI LF 8.5 STRL (GLOVE) ×3 IMPLANT
GOWN STRL REUS W/TWL LRG LVL3 (GOWN DISPOSABLE) ×9 IMPLANT
GOWN STRL REUS W/TWL XL LVL3 (GOWN DISPOSABLE) ×3 IMPLANT
GUIDEROD T2 3X1000 (ROD) ×3 IMPLANT
INST SET MAJOR BONE (KITS) ×3 IMPLANT
K-WIRE  3.2X450M STR (WIRE) ×2
K-WIRE 3.2X450M STR (WIRE) ×1
KIT BLADEGUARD II DBL (SET/KITS/TRAYS/PACK) ×3 IMPLANT
KIT ROOM TURNOVER AP CYSTO (KITS) ×3 IMPLANT
KWIRE 3.2X450M STR (WIRE) ×1 IMPLANT
MANIFOLD NEPTUNE II (INSTRUMENTS) ×3 IMPLANT
MARKER SKIN DUAL TIP RULER LAB (MISCELLANEOUS) ×3 IMPLANT
NAIL TROCH GAMMA 11X18 (Nail) ×2 IMPLANT
NDL HYPO 21X1.5 SAFETY (NEEDLE) ×1 IMPLANT
NDL SPNL 18GX3.5 QUINCKE PK (NEEDLE) ×1 IMPLANT
NEEDLE HYPO 21X1.5 SAFETY (NEEDLE) ×3 IMPLANT
NEEDLE SPNL 18GX3.5 QUINCKE PK (NEEDLE) ×3 IMPLANT
NS IRRIG 1000ML POUR BTL (IV SOLUTION) ×3 IMPLANT
PACK BASIC III (CUSTOM PROCEDURE TRAY) ×3
PACK SRG BSC III STRL LF ECLPS (CUSTOM PROCEDURE TRAY) ×1 IMPLANT
PENCIL HANDSWITCHING (ELECTRODE) ×3 IMPLANT
SCREW LAG GAMMA 3 95MM (Screw) ×2 IMPLANT
SCREW LOCKING T2 F/T  5X37.5MM (Screw) ×2 IMPLANT
SCREW LOCKING T2 F/T 5X37.5MM (Screw) IMPLANT
SET BASIN LINEN APH (SET/KITS/TRAYS/PACK) ×3 IMPLANT
SPONGE LAP 18X18 X RAY DECT (DISPOSABLE) ×6 IMPLANT
STAPLER VISISTAT 35W (STAPLE) ×2 IMPLANT
SUT MNCRL 0 VIOLET CTX 36 (SUTURE) ×1 IMPLANT
SUT MON AB 2-0 CT1 36 (SUTURE) ×3 IMPLANT
SUT MONOCRYL 0 CTX 36 (SUTURE) ×2
SYR 30ML LL (SYRINGE) ×3 IMPLANT
SYR BULB IRRIGATION 50ML (SYRINGE) ×6 IMPLANT
YANKAUER SUCT 12FT TUBE ARGYLE (SUCTIONS) ×3 IMPLANT

## 2014-10-13 NOTE — Transfer of Care (Signed)
Immediate Anesthesia Transfer of Care Note  Patient: Sara Bowman  Procedure(s) Performed: Procedure(s): INTRAMEDULLARY (IM) NAIL INTERTROCHANTRIC RIGHT HIP (Right)  Patient Location: PACU  Anesthesia Type:Spinal  Level of Consciousness: awake, alert , oriented and patient cooperative  Airway & Oxygen Therapy: Patient Spontanous Breathing and Patient connected to nasal cannula oxygen  Post-op Assessment: Report given to RN and Post -op Vital signs reviewed and stable  Post vital signs: Reviewed and stable  Last Vitals:  Filed Vitals:   10/13/14 1035  BP: 131/63  Pulse:   Temp:   Resp: 11    Complications: No apparent anesthesia complications

## 2014-10-13 NOTE — Progress Notes (Signed)
Patient ID: Sara Bowman, female   DOB: 1923-12-27, 81 y.o.   MRN: [id number]  Preoperative note  Diagnosis intertrochanteric fracture right hip  Plan surgical treatment intramedullary gamma nail  CBC  CBC Latest Ref Rng 10/12/2014 08/17/2014 07/03/2006  WBC 4.0 - 10.5 K/uL 12.7(H) 7.8 7.9  Hemoglobin 12.0 - 15.0 g/dL 13.8 14.4 13.9  Hematocrit 36.0 - 46.0 % 38.6 40.8 39.7  Platelets 150 - 400 K/uL 180 196 216      BASIC metabolic panel BMET    Component Value Date/Time   NA 128* 10/12/2014 2124   K 4.4 10/12/2014 2124   CL 96* 10/12/2014 2124   CO2 26 10/12/2014 2124   GLUCOSE 119* 10/12/2014 2124   BUN 12 10/12/2014 2124   CREATININE 0.60 10/12/2014 2124   CALCIUM 8.4* 10/12/2014 2124   GFRNONAA >60 10/12/2014 2124   GFRAA >60 10/12/2014 2124    INR=1.10  BLOOD UNITS AVAILABLE type and cross for 3 units stay 2 units ahead  Chest x-ray  no active disease:  FINDINGS: Single-view of the chest demonstrates emphysematous changes of the lungs. No focal consolidation, pleural effusion, or pneumothorax. Stable cardiac silhouette. A 6 mm nodular appearing density in the right hilar region appears similar the prior study and likely represents a vessel on end. Right axillary surgical clips noted. Osteopenia with degenerative changes of the spine. No acute fracture.   IMPRESSION: No active disease.     Electronically Signed   By: Anner Crete M.D.   On: 10/12/2014 21:25     EKG  sinus rhythm  Plain film findings  comminuted fracture 3 parts intertrochanteric fracture right hip  OR NOTIFIED yes

## 2014-10-13 NOTE — Consult Note (Signed)
Reason for Consult: fractured right hip Referring Physician: Dr Orvan Falconer  Sara Bowman is an 81 y.o. female.   (From medical history confirmed thru patientts daughter) Chief Complaint: fell and has right Fx hip with mild rhabdo.   HPI: Sara Bowman is an 81 y.o. female with hx of mild to mod dementia, lives at home, with her daughter living nearby, hx of HTN, breast cancer, fell around noon yesterday, and brought to the ER, where her x ray showed comminuted Fx of the right comminuted intertroch Fx with mild varus deformity.  She was also found to have a mild elevation of her CPK (1200), with normal renal fx. Her Na was noted to be 128, and her Hb was 13 grams per dL.  EKG was unremarkable and her CXR was clear.  EDP spoke with Dr Aline Brochure of orthopedics, and hospitalist was asked to admit her for further surgical repair.   She has no CP, SOB, abdominal pain, or any other symptomology.    Household ambulator without assistive devices lives alone with family support  Rewiew of Systems:  Constitutional: Negative for malaise, fever and chills. No significant weight loss or weight gain Eyes: Negative for eye pain, redness and discharge, diplopia, visual changes, or flashes of light. ENMT: Negative for ear pain, hoarseness, nasal congestion, sinus pressure and sore throat. No headaches; tinnitus, drooling, or problem swallowing. Cardiovascular: Negative for chest pain, palpitations, diaphoresis, dyspnea and peripheral edema. ; No orthopnea, PND Respiratory: Negative for cough, hemoptysis, wheezing and stridor. No pleuritic chestpain. Gastrointestinal: Negative for nausea, vomiting, diarrhea, constipation, abdominal pain, melena, blood in stool, hematemesis, jaundice and rectal bleeding.     Genitourinary: Negative for frequency, dysuria, incontinence,flank pain and hematuria; Musculoskeletal: Negative for back pain and neck pain.  Skin: . Negative for pruritus, rash, abrasions, bruising and  skin lesion.; ulcerations Neuro: Negative for headache, lightheadedness and neck stiffness. Negative for weakness, altered level of consciousness , altered mental status, extremity weakness, burning feet, involuntary movement, seizure and syncope.   Psych: negative for anxiety, depression, insomnia, tearfulness, panic attacks, hallucinations, paranoia, suicidal or homicidal ideation    Past Medical History  Diagnosis Date  . Cancer     breast  . Hypertension   . Confusion   . Dementia     Past Surgical History  Procedure Laterality Date  . Abdominal hysterectomy      Family History  Problem Relation Age of Onset  . Aneurysm Mother     Social History:  reports that she has never smoked. She does not have any smokeless tobacco history on file. She reports that she does not drink alcohol or use illicit drugs.  Allergies: No Known Allergies  Medications: I have reviewed the patient's current medications.  Results for orders placed or performed during the hospital encounter of 10/12/14 (from the past 48 hour(s))  CBC with Differential/Platelet     Status: Abnormal   Collection Time: 10/12/14  9:24 PM  Result Value Ref Range   WBC 12.7 (H) 4.0 - 10.5 K/uL   RBC 4.47 3.87 - 5.11 MIL/uL   Hemoglobin 13.8 12.0 - 15.0 g/dL   HCT 38.6 36.0 - 46.0 %   MCV 86.4 78.0 - 100.0 fL   MCH 30.9 26.0 - 34.0 pg   MCHC 35.8 30.0 - 36.0 g/dL   RDW 12.4 11.5 - 15.5 %   Platelets 180 150 - 400 K/uL   Neutrophils Relative % 92 %   Neutro Abs 11.6 (H) 1.7 -  7.7 K/uL   Lymphocytes Relative 5 %   Lymphs Abs 0.6 (L) 0.7 - 4.0 K/uL   Monocytes Relative 3 %   Monocytes Absolute 0.4 0.1 - 1.0 K/uL   Eosinophils Relative 0 %   Eosinophils Absolute 0.0 0.0 - 0.7 K/uL   Basophils Relative 0 %   Basophils Absolute 0.0 0.0 - 0.1 K/uL  CK     Status: Abnormal   Collection Time: 10/12/14  9:24 PM  Result Value Ref Range   Total CK 1216 (H) 38 - 234 U/L  Protime-INR     Status: None   Collection  Time: 10/12/14  9:24 PM  Result Value Ref Range   Prothrombin Time 14.4 11.6 - 15.2 seconds   INR 1.10 0.00 - 0.35  Basic metabolic panel     Status: Abnormal   Collection Time: 10/12/14  9:24 PM  Result Value Ref Range   Sodium 128 (L) 135 - 145 mmol/L   Potassium 4.4 3.5 - 5.1 mmol/L   Chloride 96 (L) 101 - 111 mmol/L   CO2 26 22 - 32 mmol/L   Glucose, Bld 119 (H) 65 - 99 mg/dL   BUN 12 6 - 20 mg/dL   Creatinine, Ser 0.60 0.44 - 1.00 mg/dL   Calcium 8.4 (L) 8.9 - 10.3 mg/dL   GFR calc non Af Amer >60 >60 mL/min   GFR calc Af Amer >60 >60 mL/min    Comment: (NOTE) The eGFR has been calculated using the CKD EPI equation. This calculation has not been validated in all clinical situations. eGFR's persistently <60 mL/min signify possible Chronic Kidney Disease.    Anion gap 6 5 - 15  Brain natriuretic peptide     Status: Abnormal   Collection Time: 10/12/14  9:25 PM  Result Value Ref Range   B Natriuretic Peptide 128.0 (H) 0.0 - 100.0 pg/mL  Type and screen     Status: None   Collection Time: 10/12/14  9:25 PM  Result Value Ref Range   ABO/RH(D) O POS    Antibody Screen NEG    Sample Expiration 10/15/2014   Urinalysis, Routine w reflex microscopic (not at [facility])     Status: Abnormal   Collection Time: 10/12/14 10:17 PM  Result Value Ref Range   Color, Urine YELLOW YELLOW   APPearance CLEAR CLEAR   Specific Gravity, Urine 1.010 1.005 - 1.030   pH 7.0 5.0 - 8.0   Glucose, UA NEGATIVE NEGATIVE mg/dL   Hgb urine dipstick SMALL (A) NEGATIVE   Bilirubin Urine NEGATIVE NEGATIVE   Ketones, ur TRACE (A) NEGATIVE mg/dL   Protein, ur NEGATIVE NEGATIVE mg/dL   Urobilinogen, UA 0.2 0.0 - 1.0 mg/dL   Nitrite NEGATIVE NEGATIVE   Leukocytes, UA NEGATIVE NEGATIVE  Urine microscopic-add on     Status: None   Collection Time: 10/12/14 10:17 PM  Result Value Ref Range   Squamous Epithelial / LPF RARE RARE   RBC / HPF 0-2 <3 RBC/hpf   Bacteria, UA RARE RARE    Dg Chest 1  View  10/12/2014   CLINICAL DATA:  81 year old female with fall and hip pain  EXAM: CHEST  1 VIEW  COMPARISON:  Radiograph dated 08/17/2014  FINDINGS: Single-view of the chest demonstrates emphysematous changes of the lungs. No focal consolidation, pleural effusion, or pneumothorax. Stable cardiac silhouette. A 6 mm nodular appearing density in the right hilar region appears similar the prior study and likely represents a vessel on end. Right axillary surgical clips noted.  Osteopenia with degenerative changes of the spine. No acute fracture.  IMPRESSION: No active disease.   Electronically Signed   By: Anner Crete M.D.   On: 10/12/2014 21:25   Dg Hip Unilat With Pelvis 2-3 Views Right  10/12/2014   CLINICAL DATA:  81 year old female with right hip pain after falling  EXAM: DG HIP (WITH OR WITHOUT PELVIS) 2-3V RIGHT  COMPARISON:  None.  FINDINGS: Mildly comminuted right intertrochanteric fracture with varus angulation of the femoral neck. The femoral head remains located. Nonspecific sclerotic density in the subchondral aspect of the superior right femoral head. The bones appear osteopenic. Degenerative disc disease present in the lower lumbar spine.  IMPRESSION: 1. Acute mildly comminuted right intertrochanteric fracture with resultant varus angulation of the femoral neck. 2. Nonspecific subchondral sclerosis in the superior aspect of the right femoral head. This is favored to represent reactive subchondral sclerosis in the setting of osteoarthritis. A sclerotic metastatic lesion is considered less likely but difficult to exclude entirely. 3. Osteopenia 4. Lower lumbar degenerative disc disease.   Electronically Signed   By: Jacqulynn Cadet M.D.   On: 10/12/2014 21:22    ROS Blood pressure 138/61, pulse 85, temperature 98.7 F (37.1 C), temperature source Oral, resp. rate 20, height 5' 4"  (1.626 m), weight 105 lb (47.628 kg), SpO2 98 %. Physical Exam  Nursing note and vitals  reviewed. Constitutional: She appears well-developed and well-nourished. No distress.  HENT:  Head: Normocephalic and atraumatic.  Right Ear: External ear normal.  Left Ear: External ear normal.  Eyes: Conjunctivae and EOM are normal. Pupils are equal, round, and reactive to light. Right eye exhibits no discharge. Left eye exhibits no discharge. No scleral icterus.  Neck: Normal range of motion. Neck supple. No JVD present. No tracheal deviation present. No thyromegaly present.  Cardiovascular: Normal rate.   Respiratory: Effort normal. No stridor. No respiratory distress.  GI: Soft. She exhibits no distension.  Musculoskeletal:       Right hip: She exhibits tenderness, bony tenderness, swelling and deformity. She exhibits no laceration.       Left hip: Normal.       Left knee: Normal.       Right upper arm: Normal.       Left upper arm: Normal.       Left upper leg: Normal.       Right lower leg: Normal.       Right foot: Normal.       Left foot: Normal.  Lymphadenopathy:    She has no cervical adenopathy.  Neurological: She is alert. She exhibits normal muscle tone.  Skin: Skin is warm and dry. No rash noted. She is not diaphoretic. No erythema. No pallor.  Multiple subcutaneous ecchymotic areas And left hip abrasion  Psychiatric: She has a normal mood and affect. Her behavior is normal. Judgment and thought content normal.    Assessment/Plan: Comminuted stable intertrochanteric fracture right hip  Recommend right hip intramedullary nail  Risks and benefits of the procedure explained to the patient's daughter who is power of attorney. She agrees to proceed with internal fixation.  Arther Abbott 10/13/2014, 7:16 AM

## 2014-10-13 NOTE — Anesthesia Preprocedure Evaluation (Signed)
Anesthesia Evaluation  Patient identified by MRN, date of birth, ID band Patient awake    Reviewed: Allergy & Precautions, NPO status , Patient's Chart, lab work & pertinent test results  Airway Mallampati: II  TM Distance: >3 FB     Dental  (+) Partial Upper, Partial Lower   Pulmonary neg pulmonary ROS,    breath sounds clear to auscultation       Cardiovascular hypertension, Pt. on medications  Rhythm:Regular Rate:Normal     Neuro/Psych PSYCHIATRIC DISORDERS (hx mild dementia)    GI/Hepatic negative GI ROS,   Endo/Other    Renal/GU      Musculoskeletal   Abdominal   Peds  Hematology   Anesthesia Other Findings   Reproductive/Obstetrics                             Anesthesia Physical Anesthesia Plan  ASA: III  Anesthesia Plan: Spinal   Post-op Pain Management:    Induction:   Airway Management Planned: Simple Face Mask  Additional Equipment:   Intra-op Plan:   Post-operative Plan:   Informed Consent: I have reviewed the patients History and Physical, chart, labs and discussed the procedure including the risks, benefits and alternatives for the proposed anesthesia with the patient or authorized representative who has indicated his/her understanding and acceptance.     Plan Discussed with:   Anesthesia Plan Comments:         Anesthesia Quick Evaluation

## 2014-10-13 NOTE — Brief Op Note (Signed)
10/13/2014  12:08 PM  PATIENT:  Sara Bowman  81 y.o. female  PRE-OPERATIVE DIAGNOSIS:  right hip fracture  POST-OPERATIVE DIAGNOSIS:  right hip fracture  Implants Stryker gamma nail, 125 short gamma nail, distal locking screw 37.5 mm proximal lag screw 95 mm. The acorn was placed in sliding mode for the lag screw  Findings comminuted fracture 3 parts posterior medial buttress intact. Closed reduction yielded a stable reduction.  Details of procedure site marking in the preop area chart update and review. Patient taken to surgery. Spinal as stated place. Patient placed on fracture table. Left leg abduction external rotated and placed in a padded well leg holder. Right leg placed in a traction device. Peroneal post place.  C-arm brought in traction and internal rotation reduced the fracture on both AP and lateral plane.  Leg prepped and draped. Timeout completed.  Proximal incision was made at the trochanteric level extended proximally extended down to fascia. Fascia split in line with the skin incision. Blunt dissection carried down to the greater trochanter. A curved awl placed in the trochanter and confirmed position with AP lateral x-ray. Guidewire placed through the cannulated awl. 125 nail passed over the guidewire guidewire removed. Second incision made on the proximal femur this was taken down to bone with layer by layer dissection. Perforating drill used to perforate lateral cortex. Threaded guidepin placed in the center the femoral head on AP and lateral x-ray. Threaded guide pin measured 95. Over reaming of the guidepin with a 95 reamer. Lag screw placed. Proximal acorn place. Lag screw engaged confirmed. Acorn placed in sliding mode. Third incision made for distal locking screw. Stab wound made. Trochanteric cannula system advance to bone. Drill across the femur. Measured off the drill bit. 37.5 mm screw placed. Final x-rays confirmed position and reduction.  All wounds  irrigated with copious muscle saline. Proximal wound closed in layer fashion with 0 Monocryl and 2-0 Monocryl. Second wound closed with 2-0 Monocryl. Third and closed with staples. Remaining skin edges reapproximated with staples. 30 mL of Marcaine injected proximal wound.  Dressing applied.  Patient taken to recovery room on a regular bed in stable condition. Postop plan weightbearing as tolerated.  PROCEDURE:  Procedure(s): INTRAMEDULLARY (IM) NAIL INTERTROCHANTRIC RIGHT HIP (Right)  SURGEON:  Surgeon(s) and Role:    * Carole Civil, MD - Primary  PHYSICIAN ASSISTANT:   ASSISTANTS: betty ashley    ANESTHESIA:   spinal  EBL:  Total I/O In: 1100 [I.V.:1100] Out: 350 [Urine:300; Blood:50]  BLOOD ADMINISTERED:none  DRAINS: none   LOCAL MEDICATIONS USED:  MARCAINE     SPECIMEN:  No Specimen  DISPOSITION OF SPECIMEN:  N/A  COUNTS:  YES  TOURNIQUET:  * No tourniquets in log *  DICTATION: .Dragon Dictation  PLAN OF CARE: Admit to inpatient   PATIENT DISPOSITION:  PACU - hemodynamically stable.   Delay start of Pharmacological VTE agent (>24hrs) due to surgical blood loss or risk of bleeding: yes

## 2014-10-13 NOTE — Anesthesia Procedure Notes (Addendum)
Date/Time: 10/13/2014 10:37 AM Performed by: Andree Elk, AMY A Pre-anesthesia Checklist: Patient identified, Timeout performed, Emergency Drugs available, Suction available and Patient being monitored Oxygen Delivery Method: Simple face mask   Spinal Patient location during procedure: OR Start time: 10/13/2014 10:58 AM Staffing Resident/CRNA: ADAMS, AMY A Preanesthetic Checklist Completed: patient identified, site marked, surgical consent, pre-op evaluation, timeout performed, IV checked, risks and benefits discussed and monitors and equipment checked Spinal Block Patient position: right lateral decubitus Prep: Betadine Patient monitoring: heart rate, cardiac monitor, continuous pulse ox and blood pressure Approach: right paramedian Location: L3-4 Injection technique: single-shot Needle Needle type: Spinocan  Needle gauge: 22 G Needle length: 9 cm Assessment Sensory level: T8 Additional Notes ATTEMPTS:1 TRAY [id number] TRAY EXPIRATION DATE:08/2015

## 2014-10-13 NOTE — Care Management Note (Signed)
Case Management Note  Patient Details  Name: Sara Bowman MRN: [id number] Date of Birth: September 07, 1923  Expected Discharge Date:   10/15/2014               Expected Discharge Plan:  [place]  In-House Referral:  Clinical Social Work  Discharge planning Services  CM Consult  Post Acute Care Choice:  NA Choice offered to:  NA  DME Arranged:    DME Agency:     HH Arranged:    [place] Agency:     Status of Service:  Completed, signed off  Medicare Important Message Given:    Date Medicare IM Given:    Medicare IM give by:    Date Additional Medicare IM Given:    Additional Medicare Important Message give by:     If discussed at [place] of Stay Meetings, dates discussed:    Additional Comments: Pt is from home and ind at baseline. Pt admitted for hip fx and surgery performed today. Anticipate need for SNF at DC. CSW is aware and will arrange for placement if appropriate. No CM needs anticipated.  Sherald Barge, RN 10/13/2014, 2:13 PM

## 2014-10-13 NOTE — Anesthesia Postprocedure Evaluation (Signed)
  Anesthesia Post-op Note  Patient: Sara Bowman  Procedure(s) Performed: Procedure(s): INTRAMEDULLARY (IM) NAIL INTERTROCHANTRIC RIGHT HIP (Right)  Patient Location: PACU  Anesthesia Type:Spinal  Level of Consciousness: awake, alert , oriented and patient cooperative Oriented to self; slightly confused; at baseline  Airway and Oxygen Therapy: Patient Spontanous Breathing  Post-op Pain: none  Post-op Assessment: Post-op Vital signs reviewed, Patient's Cardiovascular Status Stable, Respiratory Function Stable, Patent Airway, No signs of Nausea or vomiting and Pain level controlled LLE Motor Response: No movement due to regional block LLE Sensation: No sensation (absent) RLE Motor Response: No movement due to regional block RLE Sensation: No sensation (absent) L Sensory Level: T12-Inguinal (groin) region R Sensory Level: T12-Inguinal (groin) region  Post-op Vital Signs: Reviewed and stable  Last Vitals:  Filed Vitals:   10/13/14 1300  BP: 121/70  Pulse: 58  Temp:   Resp: 13    Complications: No apparent anesthesia complications

## 2014-10-13 NOTE — Op Note (Signed)
10/13/2014  12:08 PM  PATIENT:  Sara Bowman  81 y.o. female  PRE-OPERATIVE DIAGNOSIS:  right hip fracture  POST-OPERATIVE DIAGNOSIS:  right hip fracture  Implants Stryker gamma nail, 125 short gamma nail, distal locking screw 37.5 mm proximal lag screw 95 mm. The acorn was placed in sliding mode for the lag screw  Findings comminuted fracture 3 parts posterior medial buttress intact. Closed reduction yielded a stable reduction.  Details of procedure site marking in the preop area chart update and review. Patient taken to surgery. Spinal as stated place. Patient placed on fracture table. Left leg abduction external rotated and placed in a padded well leg holder. Right leg placed in a traction device. Peroneal post place.  C-arm brought in traction and internal rotation reduced the fracture on both AP and lateral plane.  Leg prepped and draped. Timeout completed.  Proximal incision was made at the trochanteric level extended proximally extended down to fascia. Fascia split in line with the skin incision. Blunt dissection carried down to the greater trochanter. A curved awl placed in the trochanter and confirmed position with AP lateral x-ray. Guidewire placed through the cannulated awl. 125 nail passed over the guidewire guidewire removed. Second incision made on the proximal femur this was taken down to bone with layer by layer dissection. Perforating drill used to perforate lateral cortex. Threaded guidepin placed in the center the femoral head on AP and lateral x-ray. Threaded guide pin measured 95. Over reaming of the guidepin with a 95 reamer. Lag screw placed. Proximal acorn place. Lag screw engaged confirmed. Acorn placed in sliding mode. Third incision made for distal locking screw. Stab wound made. Trochanteric cannula system advance to bone. Drill across the femur. Measured off the drill bit. 37.5 mm screw placed. Final x-rays confirmed position and reduction.  All wounds  irrigated with copious muscle saline. Proximal wound closed in layer fashion with 0 Monocryl and 2-0 Monocryl. Second wound closed with 2-0 Monocryl. Third and closed with staples. Remaining skin edges reapproximated with staples. 30 mL of Marcaine injected proximal wound.  Dressing applied.  Patient taken to recovery room on a regular bed in stable condition. Postop plan weightbearing as tolerated.  PROCEDURE:  Procedure(s): INTRAMEDULLARY (IM) NAIL INTERTROCHANTRIC RIGHT HIP (Right)  SURGEON:  Surgeon(s) and Role:    * Carole Civil, MD - Primary  PHYSICIAN ASSISTANT:   ASSISTANTS: betty ashley    ANESTHESIA:   spinal  EBL:  Total I/O In: 1100 [I.V.:1100] Out: 350 [Urine:300; Blood:50]  BLOOD ADMINISTERED:none  DRAINS: none   LOCAL MEDICATIONS USED:  MARCAINE     SPECIMEN:  No Specimen  DISPOSITION OF SPECIMEN:  N/A  COUNTS:  YES  TOURNIQUET:  * No tourniquets in log *  DICTATION: .Dragon Dictation  PLAN OF CARE: Admit to inpatient   PATIENT DISPOSITION:  PACU - hemodynamically stable.   Delay start of Pharmacological VTE agent (>24hrs) due to surgical blood loss or risk of bleeding: yes

## 2014-10-13 NOTE — Progress Notes (Signed)
Events reviewed. Patient examined. She is comfortable. She is being set for surgery today and I think that is appropriate. I told her daughter and the patient that I think she is going to have to do rehabilitation after this because she lives at home alone. She has now been taken to the preoperative area for surgery

## 2014-10-14 ENCOUNTER — Encounter (HOSPITAL_COMMUNITY): Payer: Self-pay | Admitting: Orthopedic Surgery

## 2014-10-14 DIAGNOSIS — S72141A Displaced intertrochanteric fracture of right femur, initial encounter for closed fracture: Secondary | ICD-10-CM | POA: Diagnosis not present

## 2014-10-14 LAB — CBC
HEMATOCRIT: 29 % — AB (ref 36.0–46.0)
HEMOGLOBIN: 10.5 g/dL — AB (ref 12.0–15.0)
MCH: 31.9 pg (ref 26.0–34.0)
MCHC: 36.2 g/dL — AB (ref 30.0–36.0)
MCV: 88.1 fL (ref 78.0–100.0)
Platelets: 150 10*3/uL (ref 150–400)
RBC: 3.29 MIL/uL — ABNORMAL LOW (ref 3.87–5.11)
RDW: 12.6 % (ref 11.5–15.5)
WBC: 7.4 10*3/uL (ref 4.0–10.5)

## 2014-10-14 LAB — BASIC METABOLIC PANEL
ANION GAP: 3 — AB (ref 5–15)
BUN: 11 mg/dL (ref 6–20)
CALCIUM: 7.7 mg/dL — AB (ref 8.9–10.3)
CHLORIDE: 97 mmol/L — AB (ref 101–111)
CO2: 26 mmol/L (ref 22–32)
CREATININE: 0.46 mg/dL (ref 0.44–1.00)
GFR calc non Af Amer: >60 mL/min (ref >60)
Glucose, Bld: 107 mg/dL — ABNORMAL HIGH (ref 65–99)
Potassium: 4 mmol/L (ref 3.5–5.1)
SODIUM: 126 mmol/L — AB (ref 135–145)

## 2014-10-14 LAB — URINE CULTURE: Culture: NO GROWTH

## 2014-10-14 NOTE — Clinical Social Work Placement (Signed)
   CLINICAL SOCIAL WORK PLACEMENT  NOTE  Date:  10/14/2014  Patient Details  Name: Sara Bowman MRN: [id number] Date of Birth: 02-02-1923  Clinical Social Work is seeking post-discharge placement for this patient at the [place] level of care (*CSW will initial, date and re-position this form in  chart as items are completed):  Yes   Patient/family provided with Central Work Department's list of facilities offering this level of care within the geographic area requested by the patient (or if unable, by the patient's family).  Yes   Patient/family informed of their freedom to choose among providers that offer the needed level of care, that participate in Medicare, Medicaid or managed care program needed by the patient, have an available bed and are willing to accept the patient.  Yes   Patient/family informed of [facility]'s ownership interest in [facility] and [facility], as well as of the fact that they are under no obligation to receive care at these facilities.  PASRR submitted to EDS on 10/13/14     PASRR number received on 10/13/14     Existing PASRR number confirmed on       FL2 transmitted to all facilities in geographic area requested by pt/family on 10/14/14     FL2 transmitted to all facilities within larger geographic area on       Patient informed that his/her managed care company has contracts with or will negotiate with certain facilities, including the following:        Yes   Patient/family informed of bed offers received.  Patient chooses bed at [facility]     Physician recommends and patient chooses bed at      Patient to be transferred to   on  .  Patient to be transferred to facility by       Patient family notified on   of transfer.  Name of family member notified:        PHYSICIAN       Additional Comment:    _______________________________________________ Salome Arnt,  [place] 10/14/2014, 11:42 AM [phone]

## 2014-10-14 NOTE — Evaluation (Signed)
Physical Therapy Evaluation Patient Details Name: Sara Bowman MRN: [id number] DOB: May 28, 1923 Today's Date: 10/14/2014   History of Present Illness  Pt is an 81yo white female who reprted to [facility] after she sustained a fall at home. It was revealed that the patient sustained ahip fracture on R. Pt underwent ORIF of R hip and presents at evaluation on post-op day 1 with daughter at bedside.   Clinical Impression  Pt is received semirecumbent in bed upon entry, awake, alert, and willing to participate. No acute distress noted.  Pt is A&Ox3 and pleasant. Pt reports zero falls in the last 6 months.BUE strength screen is appropriate for safe use of RW. BLE strength is greatly limited at time of eval, with significant pain inhibition. Pt falls risk is high as evidenced by slow gait speed, poor forward reach, and altered posturing during transfers and mobility. Pt requires very high level of assistance for all mobility, transfers, exercises, and ambulation, and is appropriate for short term rehab stay in SNF. Patient presenting with impairment of strength, range of motion, balance, oxygen perfusion, and activity tolerance, limiting ability to perform ADL and mobility tasks at  baseline level of function. Patient will benefit from skilled intervention to address the above impairments and limitations, in order to restore to prior level of function, improve patient safety upon discharge, and to decrease falls risk.       Follow Up Recommendations SNF;Supervision for mobility/OOB    Equipment Recommendations  None recommended by PT    Recommendations for Other Services       Precautions / Restrictions Precautions Precautions: None Restrictions Weight Bearing Restrictions: Yes RLE Weight Bearing: Weight bearing as tolerated      Mobility  Bed Mobility Overal bed mobility: +2 for physical assistance;Needs Assistance Bed Mobility: Supine to Sit;Sit to Supine     Supine to sit: +2 for physical  assistance;Max assist Sit to supine: +2 for physical assistance;Max assist   General bed mobility comments: Signfiicant pain inhibition on R hip and trunk weakness, limiting ability to get out of/into bed.   Transfers Overall transfer level: Needs assistance Equipment used: Rolling walker (2 wheeled) Transfers: Sit to/from Stand Sit to Stand: From elevated surface;Mod assist         General transfer comment: difficulty coming full errect, forward flexed, lacking TKE on RLE. Reported felt nauseaated and asked to sit/lie back down.   Ambulation/Gait             General Gait Details: unable to attempt due to nausea.  Stairs            Wheelchair Mobility    Modified Rankin (Stroke Patients Only)       Balance Overall balance assessment: No apparent balance deficits (not formally assessed);Modified Independent                                           Pertinent Vitals/Pain Pain Assessment: No/denies pain Pain Score: 0-No pain Pain Location: R hip, o/10 pain while lying still, pain with movement that is not rated.  Pain Intervention(s): Limited activity within patient's tolerance;Monitored during session;Repositioned    Home Living Family/patient expects to be discharged to:: Private residence Living Arrangements: Alone Available Help at Discharge: Family;Neighbor;Available PRN/intermittently Type of Home: House Home Access: Stairs to enter Entrance Stairs-Rails: None Entrance Stairs-Number of Steps: 2 Home Layout: One level  Prior Function Level of Independence: Needs assistance   Gait / Transfers Assistance Needed: pt ambulates by holding onto furniture in the home or holds daughter's arm when outside of the home...she does at times go outside with no assistive device but goes very slowly and has poor tolerance to prolonged activity, feeling very fatigued thereafter.   ADL's / Homemaking Assistance Needed: Family assists with  groceries, meal prep, etc.         Hand Dominance   Dominant Hand: Right    Extremity/Trunk Assessment   Upper Extremity Assessment: Overall WFL for tasks assessed (strong elbow flexion/extension and grip strength bilat. )           Lower Extremity Assessment: Overall WFL for tasks assessed;RLE deficits/detail RLE Deficits / Details: Post surgical weakness on RLE, most inhibited by pain.        Communication   Communication: HOH  Cognition Arousal/Alertness: Awake/alert Behavior During Therapy: WFL for tasks assessed/performed Overall Cognitive Status: Within Functional Limits for tasks assessed       Memory: Decreased short-term memory (Difficulty answering some questions, deferring to daughter for clarification on occasion. )              General Comments      Exercises General Exercises - Lower Extremity Ankle Circles/Pumps: Supine;Both;Strengthening;10 reps Gluteal Sets: Both;10 reps;Strengthening;Supine (hips at 35 degrees, bridge initiation.) Short Arc Quad: AAROM;Strengthening;Right;15 reps;Supine (Mod Assist to complete.) Heel Slides: AAROM;Strengthening;Right;Supine;10 reps (Mod Assist to complete.) Hip ABduction/ADduction: AAROM;Strengthening;Right;15 reps;Supine (Mod Assist to complete, limited ROM (~25*))      Assessment/Plan    PT Assessment Patient needs continued PT services  PT Diagnosis Difficulty walking   PT Problem List Decreased strength;Decreased range of motion;Decreased activity tolerance;Decreased mobility;Decreased coordination  PT Treatment Interventions DME instruction;Gait training;Stair training;Functional mobility training;Therapeutic activities;Therapeutic exercise;Balance training   PT Goals (Current goals can be found in the Care Plan section) Acute Rehab PT Goals Patient Stated Goal: Pt would like to return to living at home alone with modified independence PT Goal Formulation: With patient/family Time For Goal  Achievement: 10/28/14 Potential to Achieve Goals: Good    Frequency BID   Barriers to discharge Inaccessible home environment;Decreased caregiver support      Co-evaluation               End of Session   Activity Tolerance: Patient limited by fatigue;Other (comment) (limited by acute onset of nausea.) Patient left: in bed;with bed alarm set;with family/visitor present;with call bell/phone within reach Nurse Communication: Mobility status;Other (comment)         Time: 8127-5170 PT Time Calculation (min) (ACUTE ONLY): 27 min   Charges:   PT Evaluation $Initial PT Evaluation Tier I: 1 Procedure PT Treatments $Therapeutic Exercise: 8-22 mins   PT G Codes:        Buccola,Allan C 2014/11/08, 10:39 AM 10:45 AM  Etta Grandchild, PT, DPT [place] License # [id number]

## 2014-10-14 NOTE — Progress Notes (Signed)
Postoperative note  Postoperative day number  1 right short gamma nail  Vital signs  BP 115/57 mmHg  Pulse 94  Temp(Src) 98.8 F (37.1 C) (Oral)  Resp 18  Ht 5\' 4"  (1.626 m)  Wt 105 lb (47.628 kg)  BMI 18.01 kg/m2  SpO2 96%   Pertinent labs   CBC Latest Ref Rng 10/14/2014 10/13/2014 10/12/2014  WBC 4.0 - 10.5 K/uL 7.4 9.1 12.7(H)  Hemoglobin 12.0 - 15.0 g/dL 10.5(L) 13.3 13.8  Hematocrit 36.0 - 46.0 % 29.0(L) 37.9 38.6  Platelets 150 - 400 K/uL 150 199 180    BMP Latest Ref Rng 10/14/2014 10/13/2014 10/12/2014  Glucose 65 - 99 mg/dL 107(H) 98 119(H)  BUN 6 - 20 mg/dL 11 12 12   Creatinine 0.44 - 1.00 mg/dL 0.46 0.51 0.60  Sodium 135 - 145 mmol/L 126(L) 130(L) 128(L)  Potassium 3.5 - 5.1 mmol/L 4.0 4.2 4.4  Chloride 101 - 111 mmol/L 97(L) 99(L) 96(L)  CO2 22 - 32 mmol/L 26 25 26   Calcium 8.9 - 10.3 mg/dL 7.7(L) 8.5(L) 8.4(L)    Assessment and plan   Start PT

## 2014-10-14 NOTE — Clinical Social Work Note (Signed)
Clinical Social Work Assessment  Patient Details  Name: Sara Bowman MRN: [id number] Date of Birth: 1923/09/01  Date of referral:  10/14/14               Reason for consult:  Facility Placement                Permission sought to share information with:  Family Supports Permission granted to share information::  Yes, Verbal Permission Granted  Name::     Marcelline Deist  Agency::     Relationship::  daughter  Contact Information:  [phone]  Housing/Transportation Living arrangements for the past 2 months:  [place] of Information:  Patient, Adult Children Patient Interpreter Needed:  None Criminal Activity/Legal Involvement Pertinent to Current Situation/Hospitalization:  No - Comment as needed Significant Relationships:  Adult Children Lives with:  Self Do you feel safe going back to the place where you live?  No Need for family participation in patient care:  Yes (Comment)  Care giving concerns:  Pt lives alone. Will need SNF.    Social Worker assessment / plan:  CSW met with pt and pt's daughter, Hilda Blades at bedside. Pt post op day 1 after hip fracture. She lives alone and reports that she was walking out of her bedroom and lost her balance. Her daughter calls several times a day. She found her on the floor after work. Pt states she laid there most of the day. Hilda Blades lives next door and is very involved and supportive. Pt is generally fairly independent and enjoys being outside walking around her property. They have already discussed that pt would need short term rehab and request [place] only at this point. CSW agreed to initiate referral and follow up. They are aware of Medicare coverage/criteria.   Employment status:  Retired Forensic scientist:  Medicare PT Recommendations:  [place] / Referral to community resources:  [place]  Patient/Family's Response to care:  Pt and daughter are agreeable to short term  rehab.   Patient/Family's Understanding of and Emotional Response to Diagnosis, Current Treatment, and Prognosis:  Pt and daughter are aware of treatment plan, with SNF as part of that plan. Pt reports she understands the need/benefits of SNF prior to return home. Pt's daughter provided a lot of encouragement and reassured pt of short term plan.   Emotional Assessment Appearance:  Appears stated age Attitude/Demeanor/Rapport:  Other (very pleasant) Affect (typically observed):  Appropriate Orientation:  Oriented to Place, Oriented to Self, Oriented to Situation, Oriented to  Time Alcohol / Substance use:  Not Applicable Psych involvement (Current and /or in the community):  No (Comment)  Discharge Needs  Concerns to be addressed:  Discharge Planning Concerns Readmission within the last 30 days:  No Current discharge risk:  Lives alone Barriers to Discharge:  Continued Medical Work up   [employer], [employer], [place] 10/14/2014, 8:43 AM [phone]

## 2014-10-14 NOTE — Clinical Social Work Placement (Signed)
   CLINICAL SOCIAL WORK PLACEMENT  NOTE  Date:  10/14/2014  Patient Details  Name: Sara Bowman MRN: [id number] Date of Birth: 02/03/1923  Clinical Social Work is seeking post-discharge placement for this patient at the [place] level of care (*CSW will initial, date and re-position this form in  chart as items are completed):  Yes   Patient/family provided with [facility] Work Department's list of facilities offering this level of care within the geographic area requested by the patient (or if unable, by the patient's family).  Yes   Patient/family informed of their freedom to choose among providers that offer the needed level of care, that participate in Medicare, Medicaid or managed care program needed by the patient, have an available bed and are willing to accept the patient.  Yes   Patient/family informed of [facility]'s ownership interest in [facility] and [facility], as well as of the fact that they are under no obligation to receive care at these facilities.  PASRR submitted to EDS on 10/13/14     PASRR number received on 10/13/14     Existing PASRR number confirmed on       FL2 transmitted to all facilities in geographic area requested by pt/family on 10/14/14     FL2 transmitted to all facilities within larger geographic area on       Patient informed that his/her managed care company has contracts with or will negotiate with certain facilities, including the following:            Patient/family informed of bed offers received.  Patient chooses bed at       Physician recommends and patient chooses bed at      Patient to be transferred to   on  .  Patient to be transferred to facility by       Patient family notified on   of transfer.  Name of family member notified:        PHYSICIAN       Additional Comment:    _______________________________________________ Salome Arnt, Corinne 10/14/2014, 8:38  AM [phone]

## 2014-10-14 NOTE — Anesthesia Postprocedure Evaluation (Signed)
  Anesthesia Post-op Note  Patient: Sara Bowman  Procedure(s) Performed: Procedure(s): INTRAMEDULLARY (IM) NAIL INTERTROCHANTRIC RIGHT HIP (Right)  Patient Location: Nursing Unit  Anesthesia Type:Spinal  Level of Consciousness: awake and alert   Airway and Oxygen Therapy: Patient Spontanous Breathing  Post-op Pain: mild  Post-op Assessment: Post-op Vital signs reviewed, Patient's Cardiovascular Status Stable, Respiratory Function Stable, Patent Airway, No signs of Nausea or vomiting and Adequate PO intake LLE Motor Response: Purposeful movement LLE Sensation: Full sensation RLE Motor Response: Purposeful movement RLE Sensation: Full sensation L Sensory Level: T12-Inguinal (groin) region R Sensory Level: T12-Inguinal (groin) region  Post-op Vital Signs: Reviewed and stable  Last Vitals:  Filed Vitals:   10/14/14 0900  BP: 121/62  Pulse: 90  Temp:   Resp:     Complications: No apparent anesthesia complications

## 2014-10-14 NOTE — Progress Notes (Signed)
Physical Therapy Treatment Patient Details Name: Sara Bowman MRN: [id number] DOB: [birth year] Today's Date: 10/14/2014    History of Present Illness Pt is an 81yo white female who reprted to [facility] after she sustained a fall at home. It was revealed that the patient sustained ahip fracture on R. Pt underwent ORIF of R hip and presents at evaluation on post-op day 1 with daughter at bedside.     PT Comments    Pt was alert and very cooperative, able to follow all directions.  Pain in the right hip is well controlled.  Nursing has a concern about her lack of urination since the Foley has been removed this morning.  We initiated some very gentle therapeutic exercise and then instructed in a bed to [facility] transfer.  She needed mod assist to complete this and was unable to urinate.  She was then instructed in transfer from [facility] to chair.  Follow Up Recommendations  SNF;Supervision for mobility/OOB     Equipment Recommendations  None recommended by PT    Recommendations for Other Services  none     Precautions / Restrictions Precautions Precautions: Fall Restrictions Weight Bearing Restrictions: No RLE Weight Bearing: Weight bearing as tolerated    Mobility  Bed Mobility Overal bed mobility: Needs Assistance Bed Mobility: Supine to Sit     Supine to sit: Max assist        Transfers Overall transfer level: Needs assistance Equipment used: None Transfers: Stand Pivot Transfers;Squat Pivot Transfers Sit to Stand: Max assist Stand pivot transfers: Max assist Squat pivot transfers: Mod assist     General transfer comment: pt did pivot transfer to [facility] and to recliner...able to tolerate some weight on the RLE  Ambulation/Gait             General Gait Details: unable to ambulate at this time   Stairs            Wheelchair Mobility    Modified Rankin (Stroke Patients Only)       Balance                                    Cognition  Arousal/Alertness: Awake/alert Behavior During Therapy: WFL for tasks assessed/performed Overall Cognitive Status: History of cognitive impairments - at baseline                      Exercises General Exercises - Lower Extremity Ankle Circles/Pumps: Supine;Both;Strengthening;10 reps Heel Slides: AAROM;Strengthening;Right;Supine;10 reps    General Comments        Pertinent Vitals/Pain Pain Assessment: No/denies pain    Home Living                      Prior Function            PT Goals (current goals can now be found in the care plan section) Progress towards PT goals: Progressing toward goals    Frequency  BID    PT Plan Current plan remains appropriate    Co-evaluation             End of Session Equipment Utilized During Treatment: Gait belt Activity Tolerance: Patient tolerated treatment well;No increased pain Patient left: in chair;with call bell/phone within reach;with chair alarm set;with family/visitor present     Time: 1414-1450 PT Time Calculation (min) (ACUTE ONLY): 36 min  Charges:  $Therapeutic Exercise: 8-22 mins $Therapeutic Activity: 8-22  mins                    G CodesSable Feil  PT 10/14/2014, 2:54 PM [phone]

## 2014-10-14 NOTE — Addendum Note (Signed)
Addendum  created 10/14/14 1427 by Ollen Bowl, CRNA   Modules edited: Notes Section   Notes Section:  File: [id number]

## 2014-10-14 NOTE — Plan of Care (Signed)
Problem: Acute Rehab PT Goals(only PT should resolve) Goal: Pt Will Go Supine/Side To Sit Pt will demonstrate MinA bed mobility supine to sitting edge-of-bed to return to PLOF and to decrease caregiver burden.     Goal: Patient Will Transfer Sit To/From Stand Pt will transfer sit to/from-stand with RW at supervision without loss-of-balance to demonstrate good safety awareness for independent mobility in home.     Goal: Pt Will Ambulate Pt will ambulate with RW at Supervision using a step-to pattern and equal step length for a distances greater than 69ft to demonstrate the ability to perform safe household distance ambulation at discharge.

## 2014-10-14 NOTE — Progress Notes (Signed)
Subjective: She had surgery done yesterday and tolerated it well  Objective: Vital signs in last 24 hours: Temp:  [97.3 F (36.3 C)-98.8 F (37.1 C)] 98.8 F (37.1 C) (09/22 0627) Pulse Rate:  [80-100] 94 (09/22 0627) Resp:  [11-32] 18 (09/22 0330) BP: (96-162)/(42-75) 115/57 mmHg (09/22 0627) SpO2:  [93 %-100 %] 96 % (09/22 0627) Weight change:  Last BM Date: 10/11/14  Intake/Output from previous day: 09/21 0701 - 09/22 0700 In: 1700 [P.O.:600; I.V.:1100] Out: 1075 [Urine:1025; Blood:50]  PHYSICAL EXAM General appearance: alert, cooperative and no distress Resp: clear to auscultation bilaterally Cardio: regular rate and rhythm, S1, S2 normal, no murmur, click, rub or gallop GI: soft, non-tender; bowel sounds normal; no masses,  no organomegaly Extremities: extremities normal, atraumatic, no cyanosis or edema  Lab Results:  Results for orders placed or performed during the hospital encounter of 10/12/14 (from the past 48 hour(s))  CBC with Differential/Platelet     Status: Abnormal   Collection Time: 10/12/14  9:24 PM  Result Value Ref Range   WBC 12.7 (H) 4.0 - 10.5 K/uL   RBC 4.47 3.87 - 5.11 MIL/uL   Hemoglobin 13.8 12.0 - 15.0 g/dL   HCT 38.6 36.0 - 46.0 %   MCV 86.4 78.0 - 100.0 fL   MCH 30.9 26.0 - 34.0 pg   MCHC 35.8 30.0 - 36.0 g/dL   RDW 12.4 11.5 - 15.5 %   Platelets 180 150 - 400 K/uL   Neutrophils Relative % 92 %   Neutro Abs 11.6 (H) 1.7 - 7.7 K/uL   Lymphocytes Relative 5 %   Lymphs Abs 0.6 (L) 0.7 - 4.0 K/uL   Monocytes Relative 3 %   Monocytes Absolute 0.4 0.1 - 1.0 K/uL   Eosinophils Relative 0 %   Eosinophils Absolute 0.0 0.0 - 0.7 K/uL   Basophils Relative 0 %   Basophils Absolute 0.0 0.0 - 0.1 K/uL  CK     Status: Abnormal   Collection Time: 10/12/14  9:24 PM  Result Value Ref Range   Total CK 1216 (H) 38 - 234 U/L  Protime-INR     Status: None   Collection Time: 10/12/14  9:24 PM  Result Value Ref Range   Prothrombin Time 14.4 11.6 -  15.2 seconds   INR 1.10 0.00 - 9.41  Basic metabolic panel     Status: Abnormal   Collection Time: 10/12/14  9:24 PM  Result Value Ref Range   Sodium 128 (L) 135 - 145 mmol/L   Potassium 4.4 3.5 - 5.1 mmol/L   Chloride 96 (L) 101 - 111 mmol/L   CO2 26 22 - 32 mmol/L   Glucose, Bld 119 (H) 65 - 99 mg/dL   BUN 12 6 - 20 mg/dL   Creatinine, Ser 0.60 0.44 - 1.00 mg/dL   Calcium 8.4 (L) 8.9 - 10.3 mg/dL   GFR calc non Af Amer >60 >60 mL/min   GFR calc Af Amer >60 >60 mL/min    Comment: (NOTE) The eGFR has been calculated using the CKD EPI equation. This calculation has not been validated in all clinical situations. eGFR's persistently <60 mL/min signify possible Chronic Kidney Disease.    Anion gap 6 5 - 15  Brain natriuretic peptide     Status: Abnormal   Collection Time: 10/12/14  9:25 PM  Result Value Ref Range   B Natriuretic Peptide 128.0 (H) 0.0 - 100.0 pg/mL  Type and screen     Status: None (Preliminary result)  Collection Time: 10/12/14  9:25 PM  Result Value Ref Range   ABO/RH(D) O POS    Antibody Screen NEG    Sample Expiration 10/15/2014    Unit Number [id number]    Blood Component Type RBC LR PHER1    Unit division 00    Status of Unit ALLOCATED    Transfusion Status OK TO TRANSFUSE    Crossmatch Result Compatible    Unit Number [id number]    Blood Component Type RBC LR PHER1    Unit division 00    Status of Unit ALLOCATED    Transfusion Status OK TO TRANSFUSE    Crossmatch Result Compatible    Unit Number [id number]    Blood Component Type RED CELLS,LR    Unit division 00    Status of Unit ALLOCATED    Transfusion Status OK TO TRANSFUSE    Crossmatch Result Compatible   Urine culture     Status: None (Preliminary result)   Collection Time: 10/12/14 10:17 PM  Result Value Ref Range   Specimen Description URINE, CLEAN CATCH    Special Requests NONE    Culture      NO GROWTH < 24 HOURS Performed at [facility]    Report Status PENDING    Urinalysis, Routine w reflex microscopic (not at [facility])     Status: Abnormal   Collection Time: 10/12/14 10:17 PM  Result Value Ref Range   Color, Urine YELLOW YELLOW   APPearance CLEAR CLEAR   Specific Gravity, Urine 1.010 1.005 - 1.030   pH 7.0 5.0 - 8.0   Glucose, UA NEGATIVE NEGATIVE mg/dL   Hgb urine dipstick SMALL (A) NEGATIVE   Bilirubin Urine NEGATIVE NEGATIVE   Ketones, ur TRACE (A) NEGATIVE mg/dL   Protein, ur NEGATIVE NEGATIVE mg/dL   Urobilinogen, UA 0.2 0.0 - 1.0 mg/dL   Nitrite NEGATIVE NEGATIVE   Leukocytes, UA NEGATIVE NEGATIVE  Urine microscopic-add on     Status: None   Collection Time: 10/12/14 10:17 PM  Result Value Ref Range   Squamous Epithelial / LPF RARE RARE   RBC / HPF 0-2 <3 RBC/hpf   Bacteria, UA RARE RARE  Basic metabolic panel     Status: Abnormal   Collection Time: 10/13/14  6:14 AM  Result Value Ref Range   Sodium 130 (L) 135 - 145 mmol/L   Potassium 4.2 3.5 - 5.1 mmol/L   Chloride 99 (L) 101 - 111 mmol/L   CO2 25 22 - 32 mmol/L   Glucose, Bld 98 65 - 99 mg/dL   BUN 12 6 - 20 mg/dL   Creatinine, Ser 0.51 0.44 - 1.00 mg/dL   Calcium 8.5 (L) 8.9 - 10.3 mg/dL   GFR calc non Af Amer >60 >60 mL/min   GFR calc Af Amer >60 >60 mL/min    Comment: (NOTE) The eGFR has been calculated using the CKD EPI equation. This calculation has not been validated in all clinical situations. eGFR's persistently <60 mL/min signify possible Chronic Kidney Disease.    Anion gap 6 5 - 15  CBC     Status: None   Collection Time: 10/13/14  6:14 AM  Result Value Ref Range   WBC 9.1 4.0 - 10.5 K/uL   RBC 4.33 3.87 - 5.11 MIL/uL   Hemoglobin 13.3 12.0 - 15.0 g/dL   HCT 37.9 36.0 - 46.0 %   MCV 87.5 78.0 - 100.0 fL   MCH 30.7 26.0 - 34.0 pg   MCHC 35.1 30.0 -  36.0 g/dL   RDW 12.6 11.5 - 15.5 %   Platelets 199 150 - 400 K/uL  CK     Status: Abnormal   Collection Time: 10/13/14  6:14 AM  Result Value Ref Range   Total CK 1016 (H) 38 - 234 U/L  Surgical pcr  screen     Status: None   Collection Time: 10/13/14  7:52 AM  Result Value Ref Range   MRSA, PCR NEGATIVE NEGATIVE   Staphylococcus aureus NEGATIVE NEGATIVE    Comment:        The Xpert SA Assay (FDA approved for NASAL specimens in patients over 48 years of age), is one component of a comprehensive surveillance program.  Test performance has been validated by [facility] for patients greater than or equal to 8 year old. It is not intended to diagnose infection nor to guide or monitor treatment.   Prepare RBC (crossmatch)     Status: None   Collection Time: 10/13/14  9:30 AM  Result Value Ref Range   Order Confirmation ORDER PROCESSED BY BLOOD BANK   ABO/Rh     Status: None   Collection Time: 10/13/14  9:25 PM  Result Value Ref Range   ABO/RH(D) O POS   CBC     Status: Abnormal   Collection Time: 10/14/14  5:44 AM  Result Value Ref Range   WBC 7.4 4.0 - 10.5 K/uL   RBC 3.29 (L) 3.87 - 5.11 MIL/uL   Hemoglobin 10.5 (L) 12.0 - 15.0 g/dL    Comment: DELTA CHECK NOTED   HCT 29.0 (L) 36.0 - 46.0 %   MCV 88.1 78.0 - 100.0 fL   MCH 31.9 26.0 - 34.0 pg   MCHC 36.2 (H) 30.0 - 36.0 g/dL   RDW 12.6 11.5 - 15.5 %   Platelets 150 150 - 400 K/uL  Basic metabolic panel     Status: Abnormal   Collection Time: 10/14/14  5:44 AM  Result Value Ref Range   Sodium 126 (L) 135 - 145 mmol/L   Potassium 4.0 3.5 - 5.1 mmol/L   Chloride 97 (L) 101 - 111 mmol/L   CO2 26 22 - 32 mmol/L   Glucose, Bld 107 (H) 65 - 99 mg/dL   BUN 11 6 - 20 mg/dL   Creatinine, Ser 0.46 0.44 - 1.00 mg/dL   Calcium 7.7 (L) 8.9 - 10.3 mg/dL   GFR calc non Af Amer >60 >60 mL/min   GFR calc Af Amer >60 >60 mL/min    Comment: (NOTE) The eGFR has been calculated using the CKD EPI equation. This calculation has not been validated in all clinical situations. eGFR's persistently <60 mL/min signify possible Chronic Kidney Disease.    Anion gap 3 (L) 5 - 15    ABGS No results for input(s): PHART, PO2ART, TCO2,  HCO3 in the last 72 hours.  Invalid input(s): PCO2 CULTURES Recent Results (from the past 240 hour(s))  Urine culture     Status: None (Preliminary result)   Collection Time: 10/12/14 10:17 PM  Result Value Ref Range Status   Specimen Description URINE, CLEAN CATCH  Final   Special Requests NONE  Final   Culture   Final    NO GROWTH < 24 HOURS Performed at [facility]    Report Status PENDING  Incomplete  Surgical pcr screen     Status: None   Collection Time: 10/13/14  7:52 AM  Result Value Ref Range Status   MRSA, PCR NEGATIVE NEGATIVE  Final   Staphylococcus aureus NEGATIVE NEGATIVE Final    Comment:        The Xpert SA Assay (FDA approved for NASAL specimens in patients over 75 years of age), is one component of a comprehensive surveillance program.  Test performance has been validated by [facility] for patients greater than or equal to 40 year old. It is not intended to diagnose infection nor to guide or monitor treatment.    Studies/Results: Dg Chest 1 View  10/12/2014   CLINICAL DATA:  81 year old female with fall and hip pain  EXAM: CHEST  1 VIEW  COMPARISON:  Radiograph dated 08/17/2014  FINDINGS: Single-view of the chest demonstrates emphysematous changes of the lungs. No focal consolidation, pleural effusion, or pneumothorax. Stable cardiac silhouette. A 6 mm nodular appearing density in the right hilar region appears similar the prior study and likely represents a vessel on end. Right axillary surgical clips noted. Osteopenia with degenerative changes of the spine. No acute fracture.  IMPRESSION: No active disease.   Electronically Signed   By: Anner Crete M.D.   On: 10/12/2014 21:25   Dg Hip Operative Unilat With Pelvis Right  10/13/2014   CLINICAL DATA:  Right hip fracture.  Gamma nail surgery.  EXAM: OPERATIVE 10/12/2014 HIP (WITH PELVIS IF PERFORMED) 6 VIEWS  TECHNIQUE: Fluoroscopic spot image(s) were submitted for interpretation post-operatively.   COMPARISON:  None.  FINDINGS: Sclerosis in the right femoral head. Initial images demonstrate intertrochanteric fracture.  Subsequent images demonstrate placement of air right hip gamma nail. Single distal interlocking screw. Gamma nail traverses the intertrochanteric fracture and appear satisfactorily position.  IMPRESSION: 1. Chronic sclerosis in the right femoral head, possibly a manifestation of chronic avascular necrosis. 2. Gamma nail placement traversing the intertrochanteric fracture, no complicating feature.   Electronically Signed   By: Van Clines M.D.   On: 10/13/2014 12:32   Dg Hip Unilat With Pelvis 2-3 Views Right  10/12/2014   CLINICAL DATA:  81 year old female with right hip pain after falling  EXAM: DG HIP (WITH OR WITHOUT PELVIS) 2-3V RIGHT  COMPARISON:  None.  FINDINGS: Mildly comminuted right intertrochanteric fracture with varus angulation of the femoral neck. The femoral head remains located. Nonspecific sclerotic density in the subchondral aspect of the superior right femoral head. The bones appear osteopenic. Degenerative disc disease present in the lower lumbar spine.  IMPRESSION: 1. Acute mildly comminuted right intertrochanteric fracture with resultant varus angulation of the femoral neck. 2. Nonspecific subchondral sclerosis in the superior aspect of the right femoral head. This is favored to represent reactive subchondral sclerosis in the setting of osteoarthritis. A sclerotic metastatic lesion is considered less likely but difficult to exclude entirely. 3. Osteopenia 4. Lower lumbar degenerative disc disease.   Electronically Signed   By: Jacqulynn Cadet M.D.   On: 10/12/2014 21:22    Medications:  Prior to Admission:  Prescriptions prior to admission  Medication Sig Dispense Refill Last Dose  . ALPRAZolam (XANAX) 0.25 MG tablet Take 1 tablet by mouth 2 (two) times daily as needed for anxiety.    unknown  . amLODipine (NORVASC) 5 MG tablet Take 1 tablet by mouth  daily.   10/12/2014 at Unknown time  . aspirin EC 81 MG EC tablet Take 1 tablet (81 mg total) by mouth daily. 30 tablet 12 10/11/2014 at Unknown time  . feeding supplement, ENSURE ENLIVE, (ENSURE ENLIVE) LIQD Take 237 mLs by mouth 2 (two) times daily between meals. 237 mL 12 Past Week at Unknown  time  . Lactobacillus (FLORAJEN ACIDOPHILUS PO) Take 1 capsule by mouth daily.   10/11/2014 at Unknown time  . lisinopril (PRINIVIL,ZESTRIL) 20 MG tablet Take 1 tablet by mouth daily.   10/11/2014 at Unknown time  . naproxen sodium (ALEVE) 220 MG tablet Take 220 mg by mouth daily as needed (for pain).   Past Week at Unknown time  . traZODone (DESYREL) 50 MG tablet Take 0.5 tablets (25 mg total) by mouth at bedtime. 15 tablet 12 10/11/2014 at Unknown time  . cefUROXime (CEFTIN) 250 MG tablet Take 1 tablet (250 mg total) by mouth 2 (two) times daily with a meal. (Patient not taking: Reported on 10/12/2014) 10 tablet 0   . torsemide (DEMADEX) 10 MG tablet Take 1 tablet by mouth daily.   08/16/2014   Scheduled: . acidophilus  2 capsule Oral Daily  . amLODipine  5 mg Oral Daily  . aspirin EC  325 mg Oral Q breakfast  . heparin  5,000 Units Subcutaneous 3 times per day  . Influenza vac split quadrivalent PF  0.5 mL Intramuscular Tomorrow-1000  . lisinopril  20 mg Oral Daily  . polyethylene glycol  17 g Oral Daily  . traMADol  50 mg Oral 4 times per day  . traZODone  25 mg Oral QHS   Continuous:  FGH:[id number] **OR** acetaminophen, alum & mag hydroxide-simeth, HYDROcodone-acetaminophen, menthol-cetylpyridinium **OR** phenol, metoCLOPramide **OR** metoCLOPramide (REGLAN) injection, morphine injection, ondansetron **OR** ondansetron (ZOFRAN) IV  Assesment: She was admitted with a hip fracture. She had mild rhabdomyolysis but that has resolved. She had surgery yesterday and seems to be doing okay. At baseline she has hypertension and a discrete well controlled and a history of dementia. Principal Problem:   Hip  fracture, right Active Problems:   Physical deconditioning   Essential hypertension   Dementia with behavioral disturbance   Hip fracture   Rhabdomyolysis   Closed fracture of intertrochanteric section of femur   Intertrochanteric fracture of right femur    Plan: Continue current treatments. She will have PT evaluation and plan is for her to go to skilled care facility when she is ready    LOS: 2 days   HAWKINS,EDWARD L 10/14/2014, 9:04 AM

## 2014-10-14 NOTE — Progress Notes (Signed)
OT Cancellation Note  Patient Details Name: Sara Bowman MRN: [id number] DOB: [birth year]   Cancelled Treatment:     Reason evaluation not completed: Pt just began eating breakfast when OT entered room this am. Pt daughter present in room and reports pt is independent in most ADL tasks, daughter assists with transferring into and out of the bathtub. Pt lives alone, daughter lives next door. Pt and daughter would like pt to go to SNF (preferable [facility]) for STR. Pt daughter is arranging for support when she is not able to be with pt upon discharge from SNF. Will complete full OT evaluation later today or tomorrow morning.   Guadelupe Sabin, OTR/L  [phone]  10/14/2014, 10:42 AM

## 2014-10-15 ENCOUNTER — Inpatient Hospital Stay
Admission: RE | Admit: 2014-10-15 | Discharge: 2014-11-28 | Disposition: A | Discharge status: Home / Self Care | Payer: BLUE CROSS/BLUE SHIELD | Source: Ambulatory Visit | Attending: Pulmonary Disease | Admitting: Pulmonary Disease

## 2014-10-15 DIAGNOSIS — R278 Other lack of coordination: Secondary | ICD-10-CM | POA: Diagnosis not present

## 2014-10-15 DIAGNOSIS — R41841 Cognitive communication deficit: Secondary | ICD-10-CM | POA: Diagnosis not present

## 2014-10-15 DIAGNOSIS — D62 Acute posthemorrhagic anemia: Secondary | ICD-10-CM | POA: Diagnosis not present

## 2014-10-15 DIAGNOSIS — I10 Essential (primary) hypertension: Secondary | ICD-10-CM | POA: Diagnosis present

## 2014-10-15 DIAGNOSIS — M84459A Pathological fracture, hip, unspecified, initial encounter for fracture: Secondary | ICD-10-CM | POA: Diagnosis not present

## 2014-10-15 DIAGNOSIS — M6282 Rhabdomyolysis: Secondary | ICD-10-CM | POA: Diagnosis not present

## 2014-10-15 DIAGNOSIS — Z9181 History of falling: Secondary | ICD-10-CM | POA: Diagnosis not present

## 2014-10-15 DIAGNOSIS — M8589 Other specified disorders of bone density and structure, multiple sites: Secondary | ICD-10-CM | POA: Diagnosis not present

## 2014-10-15 DIAGNOSIS — R4182 Altered mental status, unspecified: Secondary | ICD-10-CM | POA: Diagnosis not present

## 2014-10-15 DIAGNOSIS — E871 Hypo-osmolality and hyponatremia: Secondary | ICD-10-CM | POA: Diagnosis not present

## 2014-10-15 DIAGNOSIS — F0390 Unspecified dementia without behavioral disturbance: Secondary | ICD-10-CM | POA: Diagnosis not present

## 2014-10-15 DIAGNOSIS — S72131D Displaced apophyseal fracture of right femur, subsequent encounter for closed fracture with routine healing: Secondary | ICD-10-CM | POA: Diagnosis not present

## 2014-10-15 DIAGNOSIS — R262 Difficulty in walking, not elsewhere classified: Secondary | ICD-10-CM | POA: Diagnosis not present

## 2014-10-15 DIAGNOSIS — M79672 Pain in left foot: Secondary | ICD-10-CM | POA: Diagnosis not present

## 2014-10-15 DIAGNOSIS — Z4789 Encounter for other orthopedic aftercare: Secondary | ICD-10-CM | POA: Diagnosis not present

## 2014-10-15 DIAGNOSIS — M6281 Muscle weakness (generalized): Secondary | ICD-10-CM | POA: Diagnosis not present

## 2014-10-15 DIAGNOSIS — M1990 Unspecified osteoarthritis, unspecified site: Secondary | ICD-10-CM | POA: Diagnosis not present

## 2014-10-15 DIAGNOSIS — S72141D Displaced intertrochanteric fracture of right femur, subsequent encounter for closed fracture with routine healing: Secondary | ICD-10-CM | POA: Diagnosis not present

## 2014-10-15 LAB — CBC
HEMATOCRIT: 25.6 % — AB (ref 36.0–46.0)
HEMOGLOBIN: 9.2 g/dL — AB (ref 12.0–15.0)
MCH: 31.6 pg (ref 26.0–34.0)
MCHC: 35.9 g/dL (ref 30.0–36.0)
MCV: 88 fL (ref 78.0–100.0)
Platelets: 146 10*3/uL — ABNORMAL LOW (ref 150–400)
RBC: 2.91 MIL/uL — AB (ref 3.87–5.11)
RDW: 12.6 % (ref 11.5–15.5)
WBC: 7.7 10*3/uL (ref 4.0–10.5)

## 2014-10-15 LAB — BASIC METABOLIC PANEL
Anion gap: 4 — ABNORMAL LOW (ref 5–15)
BUN: 17 mg/dL (ref 6–20)
CHLORIDE: 95 mmol/L — AB (ref 101–111)
CO2: 29 mmol/L (ref 22–32)
Calcium: 8 mg/dL — ABNORMAL LOW (ref 8.9–10.3)
Creatinine, Ser: 0.55 mg/dL (ref 0.44–1.00)
GFR calc non Af Amer: >60 mL/min (ref >60)
Glucose, Bld: 113 mg/dL — ABNORMAL HIGH (ref 65–99)
POTASSIUM: 3.8 mmol/L (ref 3.5–5.1)
SODIUM: 128 mmol/L — AB (ref 135–145)

## 2014-10-15 MED ORDER — HYDROCODONE-ACETAMINOPHEN 5-325 MG PO TABS: 1.0000 | ORAL_TABLET | ORAL | Status: DC | PRN

## 2014-10-15 MED ORDER — RISAQUAD PO CAPS: 2.0000 | ORAL_CAPSULE | Freq: Every day | ORAL | Status: AC

## 2014-10-15 MED ORDER — POLYETHYLENE GLYCOL 3350 17 G PO PACK: 17.0000 g | PACK | Freq: Every day | ORAL | Status: DC

## 2014-10-15 NOTE — Discharge Summary (Signed)
Physician Discharge Summary  Patient ID: Sara Bowman MRN: [id number] DOB/AGE: 79-Nov-1925 79 y.o. Primary Care Physician:HAWKINS,EDWARD L, MD Admit date: 10/12/2014 Discharge date: 10/15/2014    Discharge Diagnoses:   Principal Problem:   Hip fracture, right Active Problems:   Physical deconditioning   Hyponatremia   Essential hypertension   Dementia with behavioral disturbance   Hip fracture   Rhabdomyolysis   Closed fracture of intertrochanteric section of femur   Intertrochanteric fracture of right femur   Acute blood loss anemia     Medication List    STOP taking these medications        ALEVE 220 MG tablet  Generic drug:  naproxen sodium     cefUROXime 250 MG tablet  Commonly known as:  CEFTIN     FLORAJEN ACIDOPHILUS PO      TAKE these medications        acidophilus Caps capsule  Take 2 capsules by mouth daily.     ALPRAZolam 0.25 MG tablet  Commonly known as:  XANAX  Take 1 tablet by mouth 2 (two) times daily as needed for anxiety.     amLODipine 5 MG tablet  Commonly known as:  NORVASC  Take 1 tablet by mouth daily.     aspirin 81 MG EC tablet  Take 1 tablet (81 mg total) by mouth daily.     feeding supplement (ENSURE ENLIVE) Liqd  Take 237 mLs by mouth 2 (two) times daily between meals.     HYDROcodone-acetaminophen 5-325 MG per tablet  Commonly known as:  NORCO/VICODIN  Take 1 tablet by mouth every 4 (four) hours as needed for moderate pain.     lisinopril 20 MG tablet  Commonly known as:  PRINIVIL,ZESTRIL  Take 1 tablet by mouth daily.     polyethylene glycol packet  Commonly known as:  MIRALAX / GLYCOLAX  Take 17 g by mouth daily.     torsemide 10 MG tablet  Commonly known as:  DEMADEX  Take 1 tablet by mouth daily.     traZODone 50 MG tablet  Commonly known as:  DESYREL  Take 0.5 tablets (25 mg total) by mouth at bedtime.        Discharged Condition: Improved    Consults: Orthopedics  Significant Diagnostic  Studies: Dg Chest 1 View  10/12/2014   CLINICAL DATA:  81 year old female with fall and hip pain  EXAM: CHEST  1 VIEW  COMPARISON:  Radiograph dated 08/17/2014  FINDINGS: Single-view of the chest demonstrates emphysematous changes of the lungs. No focal consolidation, pleural effusion, or pneumothorax. Stable cardiac silhouette. A 6 mm nodular appearing density in the right hilar region appears similar the prior study and likely represents a vessel on end. Right axillary surgical clips noted. Osteopenia with degenerative changes of the spine. No acute fracture.  IMPRESSION: No active disease.   Electronically Signed   By: Anner Crete M.D.   On: 10/12/2014 21:25   Dg Hip Operative Unilat With Pelvis Right  10/13/2014   CLINICAL DATA:  Right hip fracture.  Gamma nail surgery.  EXAM: OPERATIVE 10/12/2014 HIP (WITH PELVIS IF PERFORMED) 6 VIEWS  TECHNIQUE: Fluoroscopic spot image(s) were submitted for interpretation post-operatively.  COMPARISON:  None.  FINDINGS: Sclerosis in the right femoral head. Initial images demonstrate intertrochanteric fracture.  Subsequent images demonstrate placement of air right hip gamma nail. Single distal interlocking screw. Gamma nail traverses the intertrochanteric fracture and appear satisfactorily position.  IMPRESSION: 1. Chronic sclerosis in the right femoral head, possibly  a manifestation of chronic avascular necrosis. 2. Gamma nail placement traversing the intertrochanteric fracture, no complicating feature.   Electronically Signed   By: Van Clines M.D.   On: 10/13/2014 12:32   Dg Hip Unilat With Pelvis 2-3 Views Right  10/12/2014   CLINICAL DATA:  81 year old female with right hip pain after falling  EXAM: DG HIP (WITH OR WITHOUT PELVIS) 2-3V RIGHT  COMPARISON:  None.  FINDINGS: Mildly comminuted right intertrochanteric fracture with varus angulation of the femoral neck. The femoral head remains located. Nonspecific sclerotic density in the subchondral aspect  of the superior right femoral head. The bones appear osteopenic. Degenerative disc disease present in the lower lumbar spine.  IMPRESSION: 1. Acute mildly comminuted right intertrochanteric fracture with resultant varus angulation of the femoral neck. 2. Nonspecific subchondral sclerosis in the superior aspect of the right femoral head. This is favored to represent reactive subchondral sclerosis in the setting of osteoarthritis. A sclerotic metastatic lesion is considered less likely but difficult to exclude entirely. 3. Osteopenia 4. Lower lumbar degenerative disc disease.   Electronically Signed   By: Jacqulynn Cadet M.D.   On: 10/12/2014 21:22    Lab Results: Basic Metabolic Panel:  Recent Labs  10/14/14 0544 10/15/14 0556  NA 126* 128*  K 4.0 3.8  CL 97* 95*  CO2 26 29  GLUCOSE 107* 113*  BUN 11 17  CREATININE 0.46 0.55  CALCIUM 7.7* 8.0*   Liver Function Tests: No results for input(s): AST, ALT, ALKPHOS, BILITOT, PROT, ALBUMIN in the last 72 hours.   CBC:  Recent Labs  10/12/14 2124  10/14/14 0544 10/15/14 0556  WBC 12.7*  < > 7.4 7.7  NEUTROABS 11.6*  --   --   --   HGB 13.8  < > 10.5* 9.2*  HCT 38.6  < > 29.0* 25.6*  MCV 86.4  < > 88.1 88.0  PLT 180  < > 150 146*  < > = values in this interval not displayed.  Recent Results (from the past 240 hour(s))  Urine culture     Status: None   Collection Time: 10/12/14 10:17 PM  Result Value Ref Range Status   Specimen Description URINE, CLEAN CATCH  Final   Special Requests NONE  Final   Culture   Final    NO GROWTH 1 DAY Performed at [facility]    Report Status 10/14/2014 FINAL  Final  Surgical pcr screen     Status: None   Collection Time: 10/13/14  7:52 AM  Result Value Ref Range Status   MRSA, PCR NEGATIVE NEGATIVE Final   Staphylococcus aureus NEGATIVE NEGATIVE Final    Comment:        The Xpert SA Assay (FDA approved for NASAL specimens in patients over 34 years of age), is one component of a  comprehensive surveillance program.  Test performance has been validated by [facility] for patients greater than or equal to 20 year old. It is not intended to diagnose infection nor to guide or monitor treatment.      Hospital Course: This is an 81 year old who fell at home. She remained on the floor for several hours and eventually came to the emergency department after her family found her. She was found to have a hip fracture. She had mild rhabdomyolysis. She was treated surgically by Dr. Aline Brochure and started with physical therapy etc. 24 hours after surgery. She has done well and is ready for transfer to the skilled care facility.  Discharge Exam: Blood pressure 99/51, pulse 88, temperature 98.2 F (36.8 C), temperature source Oral, resp. rate 18, height 5\' 4"  (1.626 m), weight 47.628 kg (105 lb), SpO2 94 %. She is awake and alert. Her chest is clear. Her heart is regular.  Disposition: to skilled care facility. Gamma nail fixation right hip, short nail Surgery date 10/13/2014 surgeon Aline Brochure phone number [phone] Weightbearing status as tolerated DVT prophylaxis aspirin for 28 days No hip precautions are necessary Staples should come out postop day 14 Follow-up should be 4 weeks postop with AP lateral right hip x-rays She will need PT and OT and speech as needed. She will continue other treatments. She needs basic metabolic profile and CBC on 10/16/2014 and 10/20/2014 I mistakenly put that she should be on 81 mg of aspirin but she should be on 325 for 28 days and then start 81 mg daily      Discharge Instructions    Discharge to SNF when bed available    Complete by:  As directed              Signed: HAWKINS,EDWARD L   10/15/2014, 9:09 AM

## 2014-10-15 NOTE — Progress Notes (Signed)
Postoperative note  Postoperative day number  2  Status post gamma nail fixation right hip  Vital signs  BP 99/51 mmHg  Pulse 88  Temp(Src) 98.2 F (36.8 C) (Oral)  Resp 18  Ht 5\' 4"  (1.626 m)  Wt 105 lb (47.628 kg)  BMI 18.01 kg/m2  SpO2 94%   Pertinent labs   CBC Latest Ref Rng 10/15/2014 10/14/2014 10/13/2014  WBC 4.0 - 10.5 K/uL 7.7 7.4 9.1  Hemoglobin 12.0 - 15.0 g/dL 9.2(L) 10.5(L) 13.3  Hematocrit 36.0 - 46.0 % 25.6(L) 29.0(L) 37.9  Platelets 150 - 400 K/uL 146(L) 150 199      Patient complaints  Mild pain   Physical exam  Minimal drainage   Assessment and plan    Orthopedic discharge  Gamma nail fixation right hip, short nail Surgery date 10/13/2014 surgeon Aline Brochure phone number [phone] Weightbearing status as tolerated DVT prophylaxis aspirin for 28 days No hip precautions are necessary Staples should come out postop day 14 Follow-up should be 4 weeks postop with AP lateral right hip x-rays

## 2014-10-15 NOTE — Care Management Note (Signed)
Case Management Note  Patient Details  Name: Sara Bowman MRN: [id number] Date of Birth: 06/13/1923  Expected Discharge Date:                  Expected Discharge Plan:  [employer]  In-House Referral:  Clinical Social Work  Discharge planning Services  CM Consult  Post Acute Care Choice:  NA Choice offered to:  NA  DME Arranged:    DME Agency:     HH Arranged:    [place] Agency:     Status of Service:  Completed, signed off  Medicare Important Message Given:  Yes-second notification given Date Medicare IM Given:    Medicare IM give by:    Date Additional Medicare IM Given:    Additional Medicare Important Message give by:     If discussed at [place] of Stay Meetings, dates discussed:    Additional Comments: Pt discharging to SNF today. CSW has arranged for placement. No CM needs.  Sherald Barge, RN 10/15/2014, 9:40 AM

## 2014-10-15 NOTE — Evaluation (Signed)
Occupational Therapy Evaluation Patient Details Name: Sara Bowman MRN: [id number] DOB: 03-10-1923 Today's Date: 10/15/2014    History of Present Illness Pt is an 81 yo white female who reprted to [facility] after she sustained a fall at home. It was revealed that the patient sustained ahip fracture on R. Pt underwent ORIF of R hip and presents at evaluation on post-op day 1 with daughter at bedside.    Clinical Impression   Pt awake and alert this am, finished breakfast upon OT arrival. Pt very pleasant and cooperative this am, reporting 3/10 pain in right hip. Pt required mod assist for bed mobility and squat pivot transfer bed-chair. Pt unable to complete sit-stand transfer due to pain this am. Pt requires increased level of assistance during ADL tasks due to pain in RLE limiting her ability to complete functional tasks independently. Pt is transferring to [facility] today, recommend OT evaluation at SNF. No further acute OT needs at this time.     Follow Up Recommendations  SNF;Supervision/Assistance - 24 hour    Equipment Recommendations  None recommended by OT       Precautions / Restrictions Precautions Precautions: Fall Restrictions Weight Bearing Restrictions: No RLE Weight Bearing: Weight bearing as tolerated      Mobility Bed Mobility Overal bed mobility: Needs Assistance Bed Mobility: Supine to Sit     Supine to sit: Mod assist;HOB elevated        Transfers Overall transfer level: Needs assistance Equipment used: None Transfers: Squat Pivot Transfers     Squat pivot transfers: Mod assist     General transfer comment: Pt completed squat pivot transfer from bed to recliner, was able to bear minimal weight on RLE         ADL Overall ADL's : Needs assistance/impaired Eating/Feeding: Modified independent                   Lower Body Dressing: Moderate assistance;Sitting/lateral leans                 General ADL Comments: Pt requires assistance  for LB dressing tasks due to pain in right hip. Pt also requires set-up for grooming tasks in sitting due to inability to stand for any length of time.                Pertinent Vitals/Pain Pain Assessment: 0-10 Pain Score: 3  Pain Location: right hip Pain Descriptors / Indicators: Aching;Grimacing Pain Intervention(s): Limited activity within patient's tolerance;Monitored during session     Hand Dominance Right   Extremity/Trunk Assessment Upper Extremity Assessment Upper Extremity Assessment: Overall WFL for tasks assessed   Lower Extremity Assessment Lower Extremity Assessment: Defer to PT evaluation       Communication Communication Communication: HOH   Cognition Arousal/Alertness: Awake/alert Behavior During Therapy: WFL for tasks assessed/performed Overall Cognitive Status: History of cognitive impairments - at baseline       Memory: Decreased short-term memory                        Home Living Family/patient expects to be discharged to:: Private residence Living Arrangements: Alone Available Help at Discharge: Family;Available PRN/intermittently Type of Home: House             Bathroom Shower/Tub: Teacher, early years/pre: Standard     Home Equipment: Environmental consultant - 2 wheels;Bedside commode;Shower seat          Prior Functioning/Environment Level of Independence: Needs assistance  ADL's / Homemaking Assistance Needed: Family assist with transferring into and out of bathtub, as well as with bathing tasks. Pt reports daughter supervises during dressing tasks. Family assists with housekeeping tasks, meal prep, and medication management tasks.         OT Diagnosis: Acute pain   OT Problem List: Pain;Decreased cognition    End of Session Equipment Utilized During Treatment: Gait belt  Activity Tolerance: Patient limited by pain Patient left: in chair;with call bell/phone within reach;with chair alarm set   Time: 0812-0852 OT  Time Calculation (min): 40 min Charges:  OT General Charges $OT Visit: 1 Procedure OT Evaluation $Initial OT Evaluation Tier I: 1 Procedure  Guadelupe Sabin, OTR/L  [phone]  10/15/2014, 9:03 AM

## 2014-10-15 NOTE — Progress Notes (Signed)
Report given to [facility], Nurse over at [facility] center she verbalized understanding.  She was taking report for Rena and I voiced to her that she could call me with any further questions.  She verbalized understanding. I did voice to her that I had held her BP meds due to the patients SBP in the 90's.

## 2014-10-15 NOTE — Clinical Social Work Placement (Signed)
   CLINICAL SOCIAL WORK PLACEMENT  NOTE  Date:  10/15/2014  Patient Details  Name: Sara Bowman MRN: [id number] Date of Birth: 04/30/1923  Clinical Social Work is seeking post-discharge placement for this patient at the [place] level of care (*CSW will initial, date and re-position this form in  chart as items are completed):  Yes   Patient/family provided with [place] Work Department's list of facilities offering this level of care within the geographic area requested by the patient (or if unable, by the patient's family).  Yes   Patient/family informed of their freedom to choose among providers that offer the needed level of care, that participate in Medicare, Medicaid or managed care program needed by the patient, have an available bed and are willing to accept the patient.  Yes   Patient/family informed of [place]'s ownership interest in [facility] and [facility], as well as of the fact that they are under no obligation to receive care at these facilities.  PASRR submitted to EDS on 10/13/14     PASRR number received on 10/13/14     Existing PASRR number confirmed on       FL2 transmitted to all facilities in geographic area requested by pt/family on 10/14/14     FL2 transmitted to all facilities within larger geographic area on       Patient informed that his/her managed care company has contracts with or will negotiate with certain facilities, including the following:        Yes   Patient/family informed of bed offers received.  Patient chooses bed at [facility]     Physician recommends and patient chooses bed at      Patient to be transferred to [facility] on 10/15/14.  Patient to be transferred to facility by staff     Patient family notified on 10/15/14 of transfer.  Name of family member notified:  Hilda Blades- daughter     PHYSICIAN       Additional Comment:     _______________________________________________ Salome Arnt, [place] 10/15/2014, 10:15 AM [phone]

## 2014-10-15 NOTE — Progress Notes (Signed)
Subjective: She feels well. Dr. Aline Brochure has seen her and says she is okay to be transferred to the skilled care facility  Objective: Vital signs in last 24 hours: Temp:  [98.1 F (36.7 C)-98.3 F (36.8 C)] 98.2 F (36.8 C) (09/23 0628) Pulse Rate:  [88-98] 88 (09/23 0628) Resp:  [18] 18 (09/23 0628) BP: (98-118)/(51-70) 99/51 mmHg (09/23 0628) SpO2:  [94 %-97 %] 94 % (09/23 0628) Weight change:  Last BM Date: 10/14/14  Intake/Output from previous day: 09/22 0701 - 09/23 0700 In: 1280 [P.O.:1280] Out: 500 [Urine:500]  PHYSICAL EXAM General appearance: alert, cooperative and no distress Resp: clear to auscultation bilaterally Cardio: regular rate and rhythm, S1, S2 normal, no murmur, click, rub or gallop GI: soft, non-tender; bowel sounds normal; no masses,  no organomegaly Extremities: extremities normal, atraumatic, no cyanosis or edema  Lab Results:  Results for orders placed or performed during the hospital encounter of 10/12/14 (from the past 48 hour(s))  Prepare RBC (crossmatch)     Status: None   Collection Time: 10/13/14  9:30 AM  Result Value Ref Range   Order Confirmation ORDER PROCESSED BY BLOOD BANK   ABO/Rh     Status: None   Collection Time: 10/13/14  9:25 PM  Result Value Ref Range   ABO/RH(D) O POS   CBC     Status: Abnormal   Collection Time: 10/14/14  5:44 AM  Result Value Ref Range   WBC 7.4 4.0 - 10.5 K/uL   RBC 3.29 (L) 3.87 - 5.11 MIL/uL   Hemoglobin 10.5 (L) 12.0 - 15.0 g/dL    Comment: DELTA CHECK NOTED   HCT 29.0 (L) 36.0 - 46.0 %   MCV 88.1 78.0 - 100.0 fL   MCH 31.9 26.0 - 34.0 pg   MCHC 36.2 (H) 30.0 - 36.0 g/dL   RDW 12.6 11.5 - 15.5 %   Platelets 150 150 - 400 K/uL  Basic metabolic panel     Status: Abnormal   Collection Time: 10/14/14  5:44 AM  Result Value Ref Range   Sodium 126 (L) 135 - 145 mmol/L   Potassium 4.0 3.5 - 5.1 mmol/L   Chloride 97 (L) 101 - 111 mmol/L   CO2 26 22 - 32 mmol/L   Glucose, Bld 107 (H) 65 - 99 mg/dL    BUN 11 6 - 20 mg/dL   Creatinine, Ser 0.46 0.44 - 1.00 mg/dL   Calcium 7.7 (L) 8.9 - 10.3 mg/dL   GFR calc non Af Amer >60 >60 mL/min   GFR calc Af Amer >60 >60 mL/min    Comment: (NOTE) The eGFR has been calculated using the CKD EPI equation. This calculation has not been validated in all clinical situations. eGFR's persistently <60 mL/min signify possible Chronic Kidney Disease.    Anion gap 3 (L) 5 - 15  CBC     Status: Abnormal   Collection Time: 10/15/14  5:56 AM  Result Value Ref Range   WBC 7.7 4.0 - 10.5 K/uL   RBC 2.91 (L) 3.87 - 5.11 MIL/uL   Hemoglobin 9.2 (L) 12.0 - 15.0 g/dL   HCT 25.6 (L) 36.0 - 46.0 %   MCV 88.0 78.0 - 100.0 fL   MCH 31.6 26.0 - 34.0 pg   MCHC 35.9 30.0 - 36.0 g/dL   RDW 12.6 11.5 - 15.5 %   Platelets 146 (L) 150 - 400 K/uL  Basic metabolic panel     Status: Abnormal   Collection Time: 10/15/14  5:56  AM  Result Value Ref Range   Sodium 128 (L) 135 - 145 mmol/L   Potassium 3.8 3.5 - 5.1 mmol/L   Chloride 95 (L) 101 - 111 mmol/L   CO2 29 22 - 32 mmol/L   Glucose, Bld 113 (H) 65 - 99 mg/dL   BUN 17 6 - 20 mg/dL   Creatinine, Ser 0.55 0.44 - 1.00 mg/dL   Calcium 8.0 (L) 8.9 - 10.3 mg/dL   GFR calc non Af Amer >60 >60 mL/min   GFR calc Af Amer >60 >60 mL/min    Comment: (NOTE) The eGFR has been calculated using the CKD EPI equation. This calculation has not been validated in all clinical situations. eGFR's persistently <60 mL/min signify possible Chronic Kidney Disease.    Anion gap 4 (L) 5 - 15    ABGS No results for input(s): PHART, PO2ART, TCO2, HCO3 in the last 72 hours.  Invalid input(s): PCO2 CULTURES Recent Results (from the past 240 hour(s))  Urine culture     Status: None   Collection Time: 10/12/14 10:17 PM  Result Value Ref Range Status   Specimen Description URINE, CLEAN CATCH  Final   Special Requests NONE  Final   Culture   Final    NO GROWTH 1 DAY Performed at [facility]    Report Status 10/14/2014  FINAL  Final  Surgical pcr screen     Status: None   Collection Time: 10/13/14  7:52 AM  Result Value Ref Range Status   MRSA, PCR NEGATIVE NEGATIVE Final   Staphylococcus aureus NEGATIVE NEGATIVE Final    Comment:        The Xpert SA Assay (FDA approved for NASAL specimens in patients over 36 years of age), is one component of a comprehensive surveillance program.  Test performance has been validated by [facility] for patients greater than or equal to 64 year old. It is not intended to diagnose infection nor to guide or monitor treatment.    Studies/Results: Dg Hip Operative Unilat With Pelvis Right  10/13/2014   CLINICAL DATA:  Right hip fracture.  Gamma nail surgery.  EXAM: OPERATIVE 10/12/2014 HIP (WITH PELVIS IF PERFORMED) 6 VIEWS  TECHNIQUE: Fluoroscopic spot image(s) were submitted for interpretation post-operatively.  COMPARISON:  None.  FINDINGS: Sclerosis in the right femoral head. Initial images demonstrate intertrochanteric fracture.  Subsequent images demonstrate placement of air right hip gamma nail. Single distal interlocking screw. Gamma nail traverses the intertrochanteric fracture and appear satisfactorily position.  IMPRESSION: 1. Chronic sclerosis in the right femoral head, possibly a manifestation of chronic avascular necrosis. 2. Gamma nail placement traversing the intertrochanteric fracture, no complicating feature.   Electronically Signed   By: Van Clines M.D.   On: 10/13/2014 12:32    Medications:  Prior to Admission:  Prescriptions prior to admission  Medication Sig Dispense Refill Last Dose  . ALPRAZolam (XANAX) 0.25 MG tablet Take 1 tablet by mouth 2 (two) times daily as needed for anxiety.    unknown  . amLODipine (NORVASC) 5 MG tablet Take 1 tablet by mouth daily.   10/12/2014 at Unknown time  . aspirin EC 81 MG EC tablet Take 1 tablet (81 mg total) by mouth daily. 30 tablet 12 10/11/2014 at Unknown time  . feeding supplement, ENSURE ENLIVE,  (ENSURE ENLIVE) LIQD Take 237 mLs by mouth 2 (two) times daily between meals. 237 mL 12 Past Week at Unknown time  . Lactobacillus (FLORAJEN ACIDOPHILUS PO) Take 1 capsule by mouth daily.  10/11/2014 at Unknown time  . lisinopril (PRINIVIL,ZESTRIL) 20 MG tablet Take 1 tablet by mouth daily.   10/11/2014 at Unknown time  . naproxen sodium (ALEVE) 220 MG tablet Take 220 mg by mouth daily as needed (for pain).   Past Week at Unknown time  . traZODone (DESYREL) 50 MG tablet Take 0.5 tablets (25 mg total) by mouth at bedtime. 15 tablet 12 10/11/2014 at Unknown time  . cefUROXime (CEFTIN) 250 MG tablet Take 1 tablet (250 mg total) by mouth 2 (two) times daily with a meal. (Patient not taking: Reported on 10/12/2014) 10 tablet 0   . torsemide (DEMADEX) 10 MG tablet Take 1 tablet by mouth daily.   08/16/2014   Scheduled: . acidophilus  2 capsule Oral Daily  . amLODipine  5 mg Oral Daily  . aspirin EC  325 mg Oral Q breakfast  . heparin  5,000 Units Subcutaneous 3 times per day  . lisinopril  20 mg Oral Daily  . polyethylene glycol  17 g Oral Daily  . traMADol  50 mg Oral 4 times per day  . traZODone  25 mg Oral QHS   Continuous:  OJN:NNYVQDAHFFNOH **OR** acetaminophen, alum & mag hydroxide-simeth, HYDROcodone-acetaminophen, menthol-cetylpyridinium **OR** phenol, metoCLOPramide **OR** metoCLOPramide (REGLAN) injection, morphine injection, ondansetron **OR** ondansetron (ZOFRAN) IV  Assesment: She was admitted with a right hip fracture and that has been repaired. She had some element of rhabdomyolysis which has resolved. She has physical deconditioning and needs rehabilitation. At baseline she has dementia and that's about the same. She has been hyponatremic which is chronic and she is mildly anemic from blood loss Principal Problem:   Hip fracture, right Active Problems:   Physical deconditioning   Essential hypertension   Dementia with behavioral disturbance   Hip fracture   Rhabdomyolysis   Closed  fracture of intertrochanteric section of femur   Intertrochanteric fracture of right femur    Plan: Continue current treatments but transferred to skilled care facility.    LOS: 3 days   HAWKINS,EDWARD L 10/15/2014, 9:00 AM

## 2014-10-15 NOTE — Progress Notes (Signed)
Patient transferred to the [facility] via [place] chair by staff to room 135.  Patient left the floor in stable condition.  Patient's surgical dressing and her foam dressing to her left hip prior to her transfer.

## 2014-10-15 NOTE — Care Management Important Message (Signed)
Important Message  Patient Details  Name: Sara Bowman MRN: [id number] Date of Birth: [birth year]   Medicare Important Message Given:  Yes-second notification given    Sherald Barge, RN 10/15/2014, 9:39 AM

## 2014-10-16 ENCOUNTER — Other Ambulatory Visit (HOSPITAL_COMMUNITY)
Admission: RE | Admit: 2014-10-16 | Discharge: 2014-10-16 | Disposition: A | Discharge status: Home / Self Care | Payer: Medicare Other | Source: Skilled Nursing Facility | Attending: Pulmonary Disease | Admitting: Pulmonary Disease

## 2014-10-16 DIAGNOSIS — I10 Essential (primary) hypertension: Secondary | ICD-10-CM | POA: Insufficient documentation

## 2014-10-16 DIAGNOSIS — E871 Hypo-osmolality and hyponatremia: Secondary | ICD-10-CM | POA: Insufficient documentation

## 2014-10-16 LAB — CBC WITH DIFFERENTIAL/PLATELET
BASOS ABS: 0 10*3/uL (ref 0.0–0.1)
Basophils Relative: 1 %
Eosinophils Absolute: 0.2 10*3/uL (ref 0.0–0.7)
Eosinophils Relative: 3 %
HEMATOCRIT: 25.2 % — AB (ref 36.0–46.0)
HEMOGLOBIN: 8.9 g/dL — AB (ref 12.0–15.0)
LYMPHS ABS: 1.6 10*3/uL (ref 0.7–4.0)
LYMPHS PCT: 25 %
MCH: 31.2 pg (ref 26.0–34.0)
MCHC: 35.3 g/dL (ref 30.0–36.0)
MCV: 88.4 fL (ref 78.0–100.0)
Monocytes Absolute: 0.5 10*3/uL (ref 0.1–1.0)
Monocytes Relative: 7 %
NEUTROS ABS: 4 10*3/uL (ref 1.7–7.7)
Neutrophils Relative %: 64 %
Platelets: 160 10*3/uL (ref 150–400)
RBC: 2.85 MIL/uL — AB (ref 3.87–5.11)
RDW: 12.6 % (ref 11.5–15.5)
WBC: 6.2 10*3/uL (ref 4.0–10.5)

## 2014-10-16 LAB — TYPE AND SCREEN
ABO/RH(D): O POS
Antibody Screen: NEGATIVE
UNIT DIVISION: 0
Unit division: 0
Unit division: 0

## 2014-10-16 LAB — BASIC METABOLIC PANEL
ANION GAP: 4 — AB (ref 5–15)
BUN: 14 mg/dL (ref 6–20)
CHLORIDE: 96 mmol/L — AB (ref 101–111)
CO2: 27 mmol/L (ref 22–32)
Calcium: 7.8 mg/dL — ABNORMAL LOW (ref 8.9–10.3)
Creatinine, Ser: 0.53 mg/dL (ref 0.44–1.00)
GFR calc Af Amer: >60 mL/min (ref >60)
Glucose, Bld: 103 mg/dL — ABNORMAL HIGH (ref 65–99)
POTASSIUM: 4.2 mmol/L (ref 3.5–5.1)
SODIUM: 127 mmol/L — AB (ref 135–145)

## 2014-10-18 DIAGNOSIS — I10 Essential (primary) hypertension: Secondary | ICD-10-CM | POA: Diagnosis not present

## 2014-10-18 DIAGNOSIS — M84459A Pathological fracture, hip, unspecified, initial encounter for fracture: Secondary | ICD-10-CM | POA: Diagnosis not present

## 2014-10-18 DIAGNOSIS — R4182 Altered mental status, unspecified: Secondary | ICD-10-CM | POA: Diagnosis not present

## 2014-10-20 NOTE — H&P (Signed)
Sara Bowman MRN: [id number] DOB/AGE: 1923/08/03 81 y.o. Primary Care Physician:HAWKINS,EDWARD L, MD Admit date: 10/16/2014 Chief Complaint: Hip fracture HPI: This is documentation of the history and physical are performed at the skilled care facility on 10/18/2014. She is an 81 year old who has mild to moderate dementia who fell at home and suffered an injury to her right leg. She was brought to the emergency department she was found to have a comminuted intertrochanteric fracture. She also was found to have mild rhabdomyolysis. She was admitted her rhabdomyolysis cleared and she was cleared for surgery. She underwent hip repair uneventfully. She was eventually found to need rehabilitation from her injury and surgery and was transferred to the skilled care facility for this. She says she feels okay. She is mildly confused.  Past Medical History  Diagnosis Date  . Cancer     breast  . Hypertension   . Confusion   . Dementia    Past Surgical History  Procedure Laterality Date  . Abdominal hysterectomy    . Breast lumpectomy N/A 1995  . Bladder surgery      90's  problems with surgery , had procedure undone in [place] 4 days later  . Intramedullary (im) nail intertrochanteric Right 10/13/2014    Procedure: INTRAMEDULLARY (IM) NAIL INTERTROCHANTRIC RIGHT HIP;  Surgeon: Carole Civil, MD;  Location: [facility];  Service: Orthopedics;  Laterality: Right;        Family History  Problem Relation Age of Onset  . Aneurysm Mother     Social History:  reports that she has never smoked. She does not have any smokeless tobacco history on file. She reports that she does not drink alcohol or use illicit drugs.   Allergies: No Known Allergies   (Not in a hospital admission)     VCB:SWHQP from the symptoms mentioned above,there are no other symptoms referable to all systems reviewed.  Physical Exam: There were no vitals taken for this visit. She is awake and alert and mildly  confused. Her HEENT examination is generally unremarkable. Her nose and throat are clear. Her neck is supple without masses. Her chest is clear. Her heart is regular. Her abdomen is soft without masses. Surgical scar looks okay. Central nervous system examination shows that she is grossly intact but she is mildly confused   No results for input(s): WBC, NEUTROABS, HGB, HCT, MCV, PLT in the last 72 hours. No results for input(s): NA, K, CL, CO2, GLUCOSE, BUN, CREATININE, CALCIUM, MG in the last 72 hours.  Invalid input(s): PHOlablast2(ast:2,ALT:2,alkphos:2,bilitot:2,prot:2,albumin:2)@    Recent Results (from the past 240 hour(s))  Urine culture     Status: None   Collection Time: 10/12/14 10:17 PM  Result Value Ref Range Status   Specimen Description URINE, CLEAN CATCH  Final   Special Requests NONE  Final   Culture   Final    NO GROWTH 1 DAY Performed at [facility]    Report Status 10/14/2014 FINAL  Final  Surgical pcr screen     Status: None   Collection Time: 10/13/14  7:52 AM  Result Value Ref Range Status   MRSA, PCR NEGATIVE NEGATIVE Final   Staphylococcus aureus NEGATIVE NEGATIVE Final    Comment:        The Xpert SA Assay (FDA approved for NASAL specimens in patients over 97 years of age), is one component of a comprehensive surveillance program.  Test performance has been validated by [facility] for patients greater than or equal to 1 year  old. It is not intended to diagnose infection nor to guide or monitor treatment.      Dg Chest 1 View  10/12/2014   CLINICAL DATA:  81 year old female with fall and hip pain  EXAM: CHEST  1 VIEW  COMPARISON:  Radiograph dated 08/17/2014  FINDINGS: Single-view of the chest demonstrates emphysematous changes of the lungs. No focal consolidation, pleural effusion, or pneumothorax. Stable cardiac silhouette. A 6 mm nodular appearing density in the right hilar region appears similar the prior study and likely represents a  vessel on end. Right axillary surgical clips noted. Osteopenia with degenerative changes of the spine. No acute fracture.  IMPRESSION: No active disease.   Electronically Signed   By: Anner Crete M.D.   On: 10/12/2014 21:25   Dg Hip Operative Unilat With Pelvis Right  10/13/2014   CLINICAL DATA:  Right hip fracture.  Gamma nail surgery.  EXAM: OPERATIVE 10/12/2014 HIP (WITH PELVIS IF PERFORMED) 6 VIEWS  TECHNIQUE: Fluoroscopic spot image(s) were submitted for interpretation post-operatively.  COMPARISON:  None.  FINDINGS: Sclerosis in the right femoral head. Initial images demonstrate intertrochanteric fracture.  Subsequent images demonstrate placement of air right hip gamma nail. Single distal interlocking screw. Gamma nail traverses the intertrochanteric fracture and appear satisfactorily position.  IMPRESSION: 1. Chronic sclerosis in the right femoral head, possibly a manifestation of chronic avascular necrosis. 2. Gamma nail placement traversing the intertrochanteric fracture, no complicating feature.   Electronically Signed   By: Van Clines M.D.   On: 10/13/2014 12:32   Dg Hip Unilat With Pelvis 2-3 Views Right  10/12/2014   CLINICAL DATA:  81 year old female with right hip pain after falling  EXAM: DG HIP (WITH OR WITHOUT PELVIS) 2-3V RIGHT  COMPARISON:  None.  FINDINGS: Mildly comminuted right intertrochanteric fracture with varus angulation of the femoral neck. The femoral head remains located. Nonspecific sclerotic density in the subchondral aspect of the superior right femoral head. The bones appear osteopenic. Degenerative disc disease present in the lower lumbar spine.  IMPRESSION: 1. Acute mildly comminuted right intertrochanteric fracture with resultant varus angulation of the femoral neck. 2. Nonspecific subchondral sclerosis in the superior aspect of the right femoral head. This is favored to represent reactive subchondral sclerosis in the setting of osteoarthritis. A sclerotic  metastatic lesion is considered less likely but difficult to exclude entirely. 3. Osteopenia 4. Lower lumbar degenerative disc disease.   Electronically Signed   By: Jacqulynn Cadet M.D.   On: 10/12/2014 21:22   Impression: She had an intertrochanteric fracture of her hip. She has hypertension which is pretty well controlled. She has dementia which is mild to moderate she has chronic hyponatremia which is unchanged Active Problems:   * No active hospital problems. *     Plan: Continue rehabilitation efforts.      HAWKINS,EDWARD L   10/20/2014, 8:11 AM

## 2014-10-22 ENCOUNTER — Ambulatory Visit (HOSPITAL_COMMUNITY)
Admission: RE | Admit: 2014-10-22 | Discharge: 2014-10-22 | Disposition: A | Discharge status: Home / Self Care | Payer: Medicare Other | Source: Ambulatory Visit | Attending: Pulmonary Disease | Admitting: Pulmonary Disease

## 2014-10-22 ENCOUNTER — Other Ambulatory Visit (HOSPITAL_COMMUNITY): Payer: Self-pay | Admitting: Pulmonary Disease

## 2014-10-22 DIAGNOSIS — R52 Pain, unspecified: Secondary | ICD-10-CM

## 2014-10-22 DIAGNOSIS — M79672 Pain in left foot: Secondary | ICD-10-CM | POA: Diagnosis not present

## 2014-11-15 ENCOUNTER — Ambulatory Visit (INDEPENDENT_AMBULATORY_CARE_PROVIDER_SITE_OTHER): Payer: Medicare Other

## 2014-11-15 ENCOUNTER — Ambulatory Visit (INDEPENDENT_AMBULATORY_CARE_PROVIDER_SITE_OTHER): Payer: Self-pay | Admitting: Orthopedic Surgery

## 2014-11-15 ENCOUNTER — Encounter: Payer: Self-pay | Admitting: Orthopedic Surgery

## 2014-11-15 VITALS — BP 114/52 | Ht 64.0 in | Wt 105.0 lb

## 2014-11-15 DIAGNOSIS — S72141D Displaced intertrochanteric fracture of right femur, subsequent encounter for closed fracture with routine healing: Secondary | ICD-10-CM

## 2014-11-15 NOTE — Patient Instructions (Signed)
ADVANCE PT AS TOLERATED

## 2014-11-15 NOTE — Progress Notes (Signed)
Patient ID: Sara Bowman, female   DOB: 20-Sep-1923, 81 y.o.   MRN: [id number]  Follow up visit  Chief Complaint  Patient presents with  . Follow-up    follow up     BP 114/52 mmHg  Ht 5\' 4"  (1.626 m)  Wt 105 lb (47.628 kg)  BMI 18.01 kg/m2  Encounter Diagnosis  Name Primary?  . Intertrochanteric fracture of right hip, closed, with routine healing, subsequent encounter Yes    Inotropic fracture right hip follow-up. Date of surgery 10/13/2014. Patient is short gamma nail with good success. Patient ablated with walker. Patient at the nursing center here in town. Receiving therapy  Her leg lengths are equal. She has no edema. She has multiple areas of bruising in the lower legs from poor skin but otherwise doing well x-ray looks good follow-up 4 weeks repeat x-ray continue weight-bear as tolerated

## 2014-11-21 DIAGNOSIS — R4182 Altered mental status, unspecified: Secondary | ICD-10-CM | POA: Diagnosis not present

## 2014-11-21 DIAGNOSIS — I10 Essential (primary) hypertension: Secondary | ICD-10-CM | POA: Diagnosis not present

## 2014-11-21 DIAGNOSIS — M84459A Pathological fracture, hip, unspecified, initial encounter for fracture: Secondary | ICD-10-CM | POA: Diagnosis not present

## 2014-11-29 NOTE — Progress Notes (Signed)
This is documentation of my visit of 11/21/2014 at the skilled care facility. She says she is doing okay and she is participating in therapy. She is still confused. She is very hard of hearing.  Exam shows that she is awake and alert. Her chest is pretty clear. Heart is regular she does have a systolic heart murmur. Her abdomen is soft. Surgical site looks okay. As mentioned she is confused  She had a hip fracture that was treated surgically. She had been apparently on the floor for some time so she had some rhabdomyolysis but that cleared and her surgery was uneventful. She has dementia at baseline and is still somewhat confused. She has hypertension which is well controlled  No change in treatment. Continue rehabilitation

## 2014-11-30 DIAGNOSIS — Z7982 Long term (current) use of aspirin: Secondary | ICD-10-CM | POA: Diagnosis not present

## 2014-11-30 DIAGNOSIS — Z853 Personal history of malignant neoplasm of breast: Secondary | ICD-10-CM | POA: Diagnosis not present

## 2014-11-30 DIAGNOSIS — S72001D Fracture of unspecified part of neck of right femur, subsequent encounter for closed fracture with routine healing: Secondary | ICD-10-CM | POA: Diagnosis not present

## 2014-11-30 DIAGNOSIS — M6282 Rhabdomyolysis: Secondary | ICD-10-CM | POA: Diagnosis not present

## 2014-11-30 DIAGNOSIS — I10 Essential (primary) hypertension: Secondary | ICD-10-CM | POA: Diagnosis not present

## 2014-11-30 DIAGNOSIS — F0390 Unspecified dementia without behavioral disturbance: Secondary | ICD-10-CM | POA: Diagnosis not present

## 2014-11-30 DIAGNOSIS — M1990 Unspecified osteoarthritis, unspecified site: Secondary | ICD-10-CM | POA: Diagnosis not present

## 2014-11-30 DIAGNOSIS — Z79891 Long term (current) use of opiate analgesic: Secondary | ICD-10-CM | POA: Diagnosis not present

## 2014-12-01 DIAGNOSIS — F0390 Unspecified dementia without behavioral disturbance: Secondary | ICD-10-CM | POA: Diagnosis not present

## 2014-12-01 DIAGNOSIS — I10 Essential (primary) hypertension: Secondary | ICD-10-CM | POA: Diagnosis not present

## 2014-12-01 DIAGNOSIS — M1990 Unspecified osteoarthritis, unspecified site: Secondary | ICD-10-CM | POA: Diagnosis not present

## 2014-12-01 DIAGNOSIS — M6282 Rhabdomyolysis: Secondary | ICD-10-CM | POA: Diagnosis not present

## 2014-12-01 DIAGNOSIS — Z7982 Long term (current) use of aspirin: Secondary | ICD-10-CM | POA: Diagnosis not present

## 2014-12-01 DIAGNOSIS — S72001D Fracture of unspecified part of neck of right femur, subsequent encounter for closed fracture with routine healing: Secondary | ICD-10-CM | POA: Diagnosis not present

## 2014-12-03 DIAGNOSIS — M1990 Unspecified osteoarthritis, unspecified site: Secondary | ICD-10-CM | POA: Diagnosis not present

## 2014-12-03 DIAGNOSIS — F0390 Unspecified dementia without behavioral disturbance: Secondary | ICD-10-CM | POA: Diagnosis not present

## 2014-12-03 DIAGNOSIS — Z7982 Long term (current) use of aspirin: Secondary | ICD-10-CM | POA: Diagnosis not present

## 2014-12-03 DIAGNOSIS — M6282 Rhabdomyolysis: Secondary | ICD-10-CM | POA: Diagnosis not present

## 2014-12-03 DIAGNOSIS — S72001D Fracture of unspecified part of neck of right femur, subsequent encounter for closed fracture with routine healing: Secondary | ICD-10-CM | POA: Diagnosis not present

## 2014-12-03 DIAGNOSIS — I10 Essential (primary) hypertension: Secondary | ICD-10-CM | POA: Diagnosis not present

## 2014-12-06 DIAGNOSIS — I10 Essential (primary) hypertension: Secondary | ICD-10-CM | POA: Diagnosis not present

## 2014-12-06 DIAGNOSIS — Z7982 Long term (current) use of aspirin: Secondary | ICD-10-CM | POA: Diagnosis not present

## 2014-12-06 DIAGNOSIS — M1990 Unspecified osteoarthritis, unspecified site: Secondary | ICD-10-CM | POA: Diagnosis not present

## 2014-12-06 DIAGNOSIS — F0390 Unspecified dementia without behavioral disturbance: Secondary | ICD-10-CM | POA: Diagnosis not present

## 2014-12-06 DIAGNOSIS — M6282 Rhabdomyolysis: Secondary | ICD-10-CM | POA: Diagnosis not present

## 2014-12-06 DIAGNOSIS — S72001D Fracture of unspecified part of neck of right femur, subsequent encounter for closed fracture with routine healing: Secondary | ICD-10-CM | POA: Diagnosis not present

## 2014-12-08 DIAGNOSIS — Z7982 Long term (current) use of aspirin: Secondary | ICD-10-CM | POA: Diagnosis not present

## 2014-12-08 DIAGNOSIS — M6282 Rhabdomyolysis: Secondary | ICD-10-CM | POA: Diagnosis not present

## 2014-12-08 DIAGNOSIS — S72001D Fracture of unspecified part of neck of right femur, subsequent encounter for closed fracture with routine healing: Secondary | ICD-10-CM | POA: Diagnosis not present

## 2014-12-08 DIAGNOSIS — F0390 Unspecified dementia without behavioral disturbance: Secondary | ICD-10-CM | POA: Diagnosis not present

## 2014-12-08 DIAGNOSIS — M1990 Unspecified osteoarthritis, unspecified site: Secondary | ICD-10-CM | POA: Diagnosis not present

## 2014-12-08 DIAGNOSIS — I10 Essential (primary) hypertension: Secondary | ICD-10-CM | POA: Diagnosis not present

## 2014-12-10 DIAGNOSIS — M6282 Rhabdomyolysis: Secondary | ICD-10-CM | POA: Diagnosis not present

## 2014-12-10 DIAGNOSIS — M1990 Unspecified osteoarthritis, unspecified site: Secondary | ICD-10-CM | POA: Diagnosis not present

## 2014-12-10 DIAGNOSIS — Z7982 Long term (current) use of aspirin: Secondary | ICD-10-CM | POA: Diagnosis not present

## 2014-12-10 DIAGNOSIS — I10 Essential (primary) hypertension: Secondary | ICD-10-CM | POA: Diagnosis not present

## 2014-12-10 DIAGNOSIS — S72001D Fracture of unspecified part of neck of right femur, subsequent encounter for closed fracture with routine healing: Secondary | ICD-10-CM | POA: Diagnosis not present

## 2014-12-10 DIAGNOSIS — F0390 Unspecified dementia without behavioral disturbance: Secondary | ICD-10-CM | POA: Diagnosis not present

## 2014-12-12 DIAGNOSIS — M1990 Unspecified osteoarthritis, unspecified site: Secondary | ICD-10-CM | POA: Diagnosis not present

## 2014-12-12 DIAGNOSIS — Z7982 Long term (current) use of aspirin: Secondary | ICD-10-CM | POA: Diagnosis not present

## 2014-12-12 DIAGNOSIS — I10 Essential (primary) hypertension: Secondary | ICD-10-CM | POA: Diagnosis not present

## 2014-12-12 DIAGNOSIS — S72001D Fracture of unspecified part of neck of right femur, subsequent encounter for closed fracture with routine healing: Secondary | ICD-10-CM | POA: Diagnosis not present

## 2014-12-12 DIAGNOSIS — M6282 Rhabdomyolysis: Secondary | ICD-10-CM | POA: Diagnosis not present

## 2014-12-12 DIAGNOSIS — F0390 Unspecified dementia without behavioral disturbance: Secondary | ICD-10-CM | POA: Diagnosis not present

## 2014-12-13 ENCOUNTER — Ambulatory Visit: Payer: Medicare Other | Admitting: Orthopedic Surgery

## 2014-12-14 ENCOUNTER — Ambulatory Visit (INDEPENDENT_AMBULATORY_CARE_PROVIDER_SITE_OTHER): Payer: Medicare Other

## 2014-12-14 ENCOUNTER — Ambulatory Visit (INDEPENDENT_AMBULATORY_CARE_PROVIDER_SITE_OTHER): Payer: Self-pay | Admitting: Orthopedic Surgery

## 2014-12-14 VITALS — Ht 64.0 in | Wt 105.0 lb

## 2014-12-14 DIAGNOSIS — S72001D Fracture of unspecified part of neck of right femur, subsequent encounter for closed fracture with routine healing: Secondary | ICD-10-CM | POA: Diagnosis not present

## 2014-12-14 DIAGNOSIS — M1990 Unspecified osteoarthritis, unspecified site: Secondary | ICD-10-CM | POA: Diagnosis not present

## 2014-12-14 DIAGNOSIS — S72141D Displaced intertrochanteric fracture of right femur, subsequent encounter for closed fracture with routine healing: Secondary | ICD-10-CM

## 2014-12-14 DIAGNOSIS — M6282 Rhabdomyolysis: Secondary | ICD-10-CM | POA: Diagnosis not present

## 2014-12-14 DIAGNOSIS — F0390 Unspecified dementia without behavioral disturbance: Secondary | ICD-10-CM | POA: Diagnosis not present

## 2014-12-14 DIAGNOSIS — I10 Essential (primary) hypertension: Secondary | ICD-10-CM | POA: Diagnosis not present

## 2014-12-14 DIAGNOSIS — Z7982 Long term (current) use of aspirin: Secondary | ICD-10-CM | POA: Diagnosis not present

## 2014-12-14 NOTE — Progress Notes (Signed)
The patient had intertrochanteric fracture with gamma nail on September 21 this is her 2 month follow-up x-ray x-ray shows fracture is healing appropriately without complication of the hardware  The patient is living at home with assistance. She is having physical therapy at home.  She has excellent hip flexion her leg lengths are equal she has no edema and her plan is to x-ray again in a month she is weightbearing as tolerated

## 2014-12-15 DIAGNOSIS — M84459D Pathological fracture, hip, unspecified, subsequent encounter for fracture with routine healing: Secondary | ICD-10-CM | POA: Diagnosis not present

## 2014-12-15 DIAGNOSIS — F0390 Unspecified dementia without behavioral disturbance: Secondary | ICD-10-CM | POA: Diagnosis not present

## 2014-12-15 DIAGNOSIS — G459 Transient cerebral ischemic attack, unspecified: Secondary | ICD-10-CM | POA: Diagnosis not present

## 2014-12-15 DIAGNOSIS — I10 Essential (primary) hypertension: Secondary | ICD-10-CM | POA: Diagnosis not present

## 2014-12-21 DIAGNOSIS — M1990 Unspecified osteoarthritis, unspecified site: Secondary | ICD-10-CM | POA: Diagnosis not present

## 2014-12-21 DIAGNOSIS — S72001D Fracture of unspecified part of neck of right femur, subsequent encounter for closed fracture with routine healing: Secondary | ICD-10-CM | POA: Diagnosis not present

## 2014-12-21 DIAGNOSIS — M6282 Rhabdomyolysis: Secondary | ICD-10-CM | POA: Diagnosis not present

## 2014-12-21 DIAGNOSIS — Z7982 Long term (current) use of aspirin: Secondary | ICD-10-CM | POA: Diagnosis not present

## 2014-12-21 DIAGNOSIS — F0390 Unspecified dementia without behavioral disturbance: Secondary | ICD-10-CM | POA: Diagnosis not present

## 2014-12-21 DIAGNOSIS — I10 Essential (primary) hypertension: Secondary | ICD-10-CM | POA: Diagnosis not present

## 2014-12-23 DIAGNOSIS — I10 Essential (primary) hypertension: Secondary | ICD-10-CM | POA: Diagnosis not present

## 2014-12-23 DIAGNOSIS — F0390 Unspecified dementia without behavioral disturbance: Secondary | ICD-10-CM | POA: Diagnosis not present

## 2014-12-23 DIAGNOSIS — S72001D Fracture of unspecified part of neck of right femur, subsequent encounter for closed fracture with routine healing: Secondary | ICD-10-CM | POA: Diagnosis not present

## 2014-12-23 DIAGNOSIS — Z7982 Long term (current) use of aspirin: Secondary | ICD-10-CM | POA: Diagnosis not present

## 2014-12-23 DIAGNOSIS — M1990 Unspecified osteoarthritis, unspecified site: Secondary | ICD-10-CM | POA: Diagnosis not present

## 2014-12-23 DIAGNOSIS — M6282 Rhabdomyolysis: Secondary | ICD-10-CM | POA: Diagnosis not present

## 2014-12-28 DIAGNOSIS — S72001D Fracture of unspecified part of neck of right femur, subsequent encounter for closed fracture with routine healing: Secondary | ICD-10-CM | POA: Diagnosis not present

## 2014-12-28 DIAGNOSIS — M1990 Unspecified osteoarthritis, unspecified site: Secondary | ICD-10-CM | POA: Diagnosis not present

## 2014-12-28 DIAGNOSIS — I10 Essential (primary) hypertension: Secondary | ICD-10-CM | POA: Diagnosis not present

## 2014-12-28 DIAGNOSIS — F0390 Unspecified dementia without behavioral disturbance: Secondary | ICD-10-CM | POA: Diagnosis not present

## 2014-12-28 DIAGNOSIS — M6282 Rhabdomyolysis: Secondary | ICD-10-CM | POA: Diagnosis not present

## 2014-12-28 DIAGNOSIS — Z7982 Long term (current) use of aspirin: Secondary | ICD-10-CM | POA: Diagnosis not present

## 2014-12-30 DIAGNOSIS — M1990 Unspecified osteoarthritis, unspecified site: Secondary | ICD-10-CM | POA: Diagnosis not present

## 2014-12-30 DIAGNOSIS — Z7982 Long term (current) use of aspirin: Secondary | ICD-10-CM | POA: Diagnosis not present

## 2014-12-30 DIAGNOSIS — I10 Essential (primary) hypertension: Secondary | ICD-10-CM | POA: Diagnosis not present

## 2014-12-30 DIAGNOSIS — F0390 Unspecified dementia without behavioral disturbance: Secondary | ICD-10-CM | POA: Diagnosis not present

## 2014-12-30 DIAGNOSIS — S72001D Fracture of unspecified part of neck of right femur, subsequent encounter for closed fracture with routine healing: Secondary | ICD-10-CM | POA: Diagnosis not present

## 2014-12-30 DIAGNOSIS — M6282 Rhabdomyolysis: Secondary | ICD-10-CM | POA: Diagnosis not present

## 2015-01-13 ENCOUNTER — Ambulatory Visit (INDEPENDENT_AMBULATORY_CARE_PROVIDER_SITE_OTHER): Payer: Medicare Other

## 2015-01-13 ENCOUNTER — Ambulatory Visit (INDEPENDENT_AMBULATORY_CARE_PROVIDER_SITE_OTHER): Payer: Medicare Other | Admitting: Orthopedic Surgery

## 2015-01-13 VITALS — BP 111/53 | Ht 64.0 in | Wt 105.0 lb

## 2015-01-13 DIAGNOSIS — S72141D Displaced intertrochanteric fracture of right femur, subsequent encounter for closed fracture with routine healing: Secondary | ICD-10-CM

## 2015-01-13 NOTE — Progress Notes (Signed)
Chief Complaint  Patient presents with  . Follow-up    1 month follow up + xray right hip fx, DOS 10/13/14    Approximately 12 weeks post intertrochanteric fracture right hip today's x-ray shows fracture healing without complication of the hardware  Patient is released

## 2015-09-07 DIAGNOSIS — H3530 Unspecified macular degeneration: Secondary | ICD-10-CM | POA: Diagnosis not present

## 2015-09-07 DIAGNOSIS — Z9849 Cataract extraction status, unspecified eye: Secondary | ICD-10-CM | POA: Diagnosis not present

## 2015-09-07 DIAGNOSIS — Z961 Presence of intraocular lens: Secondary | ICD-10-CM | POA: Diagnosis not present

## 2015-10-03 DIAGNOSIS — H35423 Microcystoid degeneration of retina, bilateral: Secondary | ICD-10-CM | POA: Diagnosis not present

## 2015-10-03 DIAGNOSIS — H353113 Nonexudative age-related macular degeneration, right eye, advanced atrophic without subfoveal involvement: Secondary | ICD-10-CM | POA: Diagnosis not present

## 2015-10-03 DIAGNOSIS — H353221 Exudative age-related macular degeneration, left eye, with active choroidal neovascularization: Secondary | ICD-10-CM | POA: Diagnosis not present

## 2015-10-03 DIAGNOSIS — H43813 Vitreous degeneration, bilateral: Secondary | ICD-10-CM | POA: Diagnosis not present

## 2015-10-31 DIAGNOSIS — H353221 Exudative age-related macular degeneration, left eye, with active choroidal neovascularization: Secondary | ICD-10-CM | POA: Diagnosis not present

## 2015-10-31 DIAGNOSIS — H353113 Nonexudative age-related macular degeneration, right eye, advanced atrophic without subfoveal involvement: Secondary | ICD-10-CM | POA: Diagnosis not present

## 2015-10-31 DIAGNOSIS — H35423 Microcystoid degeneration of retina, bilateral: Secondary | ICD-10-CM | POA: Diagnosis not present

## 2015-10-31 DIAGNOSIS — H43813 Vitreous degeneration, bilateral: Secondary | ICD-10-CM | POA: Diagnosis not present

## 2015-12-12 DIAGNOSIS — H43813 Vitreous degeneration, bilateral: Secondary | ICD-10-CM | POA: Diagnosis not present

## 2015-12-12 DIAGNOSIS — H353113 Nonexudative age-related macular degeneration, right eye, advanced atrophic without subfoveal involvement: Secondary | ICD-10-CM | POA: Diagnosis not present

## 2015-12-12 DIAGNOSIS — H35423 Microcystoid degeneration of retina, bilateral: Secondary | ICD-10-CM | POA: Diagnosis not present

## 2015-12-12 DIAGNOSIS — H353221 Exudative age-related macular degeneration, left eye, with active choroidal neovascularization: Secondary | ICD-10-CM | POA: Diagnosis not present

## 2015-12-13 DIAGNOSIS — H353221 Exudative age-related macular degeneration, left eye, with active choroidal neovascularization: Secondary | ICD-10-CM | POA: Diagnosis not present

## 2015-12-20 DIAGNOSIS — H353221 Exudative age-related macular degeneration, left eye, with active choroidal neovascularization: Secondary | ICD-10-CM | POA: Diagnosis not present

## 2016-02-15 DIAGNOSIS — H353221 Exudative age-related macular degeneration, left eye, with active choroidal neovascularization: Secondary | ICD-10-CM | POA: Diagnosis not present

## 2016-02-22 DIAGNOSIS — H353221 Exudative age-related macular degeneration, left eye, with active choroidal neovascularization: Secondary | ICD-10-CM | POA: Diagnosis not present

## 2016-03-27 DIAGNOSIS — H353221 Exudative age-related macular degeneration, left eye, with active choroidal neovascularization: Secondary | ICD-10-CM | POA: Diagnosis not present

## 2016-08-31 ENCOUNTER — Emergency Department (HOSPITAL_COMMUNITY)
Admission: EM | Admit: 2016-08-31 | Discharge: 2016-08-31 | Disposition: A | Discharge status: Home / Self Care | Payer: Medicare Other | Attending: Emergency Medicine | Admitting: Emergency Medicine

## 2016-08-31 ENCOUNTER — Encounter (HOSPITAL_COMMUNITY): Payer: Self-pay | Admitting: Emergency Medicine

## 2016-08-31 ENCOUNTER — Emergency Department (HOSPITAL_COMMUNITY): Payer: Medicare Other

## 2016-08-31 DIAGNOSIS — R410 Disorientation, unspecified: Secondary | ICD-10-CM | POA: Diagnosis not present

## 2016-08-31 DIAGNOSIS — Z79899 Other long term (current) drug therapy: Secondary | ICD-10-CM | POA: Diagnosis not present

## 2016-08-31 DIAGNOSIS — Z7982 Long term (current) use of aspirin: Secondary | ICD-10-CM | POA: Diagnosis not present

## 2016-08-31 DIAGNOSIS — Z853 Personal history of malignant neoplasm of breast: Secondary | ICD-10-CM | POA: Insufficient documentation

## 2016-08-31 DIAGNOSIS — I10 Essential (primary) hypertension: Secondary | ICD-10-CM | POA: Insufficient documentation

## 2016-08-31 DIAGNOSIS — R4182 Altered mental status, unspecified: Secondary | ICD-10-CM | POA: Diagnosis not present

## 2016-08-31 LAB — URINALYSIS, ROUTINE W REFLEX MICROSCOPIC
Bilirubin Urine: NEGATIVE
GLUCOSE, UA: NEGATIVE mg/dL
HGB URINE DIPSTICK: NEGATIVE
KETONES UR: NEGATIVE mg/dL
Leukocytes, UA: NEGATIVE
Nitrite: NEGATIVE
PROTEIN: NEGATIVE mg/dL
Specific Gravity, Urine: 1.008 (ref 1.005–1.030)
pH: 7 (ref 5.0–8.0)

## 2016-08-31 LAB — CBG MONITORING, ED: GLUCOSE-CAPILLARY: 95 mg/dL (ref 65–99)

## 2016-08-31 LAB — COMPREHENSIVE METABOLIC PANEL
ALT: 16 U/L (ref 14–54)
ANION GAP: 8 (ref 5–15)
AST: 25 U/L (ref 15–41)
Albumin: 4.4 g/dL (ref 3.5–5.0)
Alkaline Phosphatase: 79 U/L (ref 38–126)
BUN: 14 mg/dL (ref 6–20)
CHLORIDE: 95 mmol/L — AB (ref 101–111)
CO2: 28 mmol/L (ref 22–32)
Calcium: 9.8 mg/dL (ref 8.9–10.3)
Creatinine, Ser: 0.74 mg/dL (ref 0.44–1.00)
Glucose, Bld: 98 mg/dL (ref 65–99)
POTASSIUM: 4.3 mmol/L (ref 3.5–5.1)
SODIUM: 131 mmol/L — AB (ref 135–145)
Total Bilirubin: 0.8 mg/dL (ref 0.3–1.2)
Total Protein: 7.4 g/dL (ref 6.5–8.1)

## 2016-08-31 LAB — CBC
HCT: 40.3 % (ref 36.0–46.0)
Hemoglobin: 13.9 g/dL (ref 12.0–15.0)
MCH: 30.3 pg (ref 26.0–34.0)
MCHC: 34.5 g/dL (ref 30.0–36.0)
MCV: 87.8 fL (ref 78.0–100.0)
PLATELETS: 219 10*3/uL (ref 150–400)
RBC: 4.59 MIL/uL (ref 3.87–5.11)
RDW: 12.9 % (ref 11.5–15.5)
WBC: 7 10*3/uL (ref 4.0–10.5)

## 2016-08-31 NOTE — Discharge Instructions (Signed)
Follow up with your md next week for recheck °

## 2016-08-31 NOTE — ED Notes (Signed)
Labs drawn 1940  IV L FA

## 2016-08-31 NOTE — ED Notes (Signed)
Dr Zammit in to assess 

## 2016-08-31 NOTE — ED Notes (Signed)
DrZammit apprised of pt  States he will be in to see pt prior to CT

## 2016-08-31 NOTE — ED Triage Notes (Signed)
Pt with dementia and confusion was found to be unable to speak in her home when daughter returned from a meeting  Last well 1330  Discovered sx 1900

## 2016-08-31 NOTE — ED Notes (Signed)
Per Dr. Roderic Palau, patient does not meet code stroke criteria.

## 2016-08-31 NOTE — ED Provider Notes (Signed)
[facility] DEPT Provider Note   CSN: [id number] Arrival date & time: 08/31/16  1932     History   Chief Complaint Chief Complaint  Patient presents with  . Altered Mental Status    HPI Sara Bowman is a 83 y.o. female.  Patient was last seen normal around 1:30. When her daughter came home late in the evening around 6 the patient was having problems finishing her sentences. This is happened many times before but lasted little bit longer this time. She has a history of dementia   The history is provided by a relative. No language interpreter was used.  Altered Mental Status   This is a recurrent problem. The current episode started less than 1 hour ago. The problem has been resolved. Pertinent negatives include no seizures and no hallucinations. Risk factors: dementia. Her past medical history does not include seizures.    Past Medical History:  Diagnosis Date  . Cancer (HCC)    breast  . Confusion   . Dementia   . Hypertension     Patient Active Problem List   Diagnosis Date Noted  . Acute blood loss anemia 10/15/2014  . Intertrochanteric fracture of right femur ([place]) 10/13/2014  . Closed fracture of intertrochanteric section of femur ([place])   . Hip fracture ([place]) 10/12/2014  . Hip fracture, right ([place]) 10/12/2014  . Rhabdomyolysis 10/12/2014  . Malnutrition of moderate degree ([place]) 08/19/2014  . Altered mental status 08/18/2014  . Elevated troponin 08/17/2014  . Altered mental state 08/17/2014  . Physical deconditioning 08/17/2014  . Hyponatremia 08/17/2014  . Essential hypertension 08/17/2014  . Dementia with behavioral disturbance 08/17/2014    Past Surgical History:  Procedure Laterality Date  . ABDOMINAL HYSTERECTOMY    . BLADDER SURGERY     90's  problems with surgery , had procedure undone in [place] 4 days later  . BREAST LUMPECTOMY N/A 1995  . INTRAMEDULLARY (IM) NAIL INTERTROCHANTERIC Right 10/13/2014   Procedure: INTRAMEDULLARY (IM) NAIL  INTERTROCHANTRIC RIGHT HIP;  Surgeon: Carole Civil, MD;  Location: [facility];  Service: Orthopedics;  Laterality: Right;    OB History    No data available       Home Medications    Prior to Admission medications   Medication Sig Start Date End Date Taking? Authorizing Provider  acidophilus (RISAQUAD) CAPS capsule Take 2 capsules by mouth daily. Patient taking differently: Take 1 capsule by mouth daily.  10/15/14  Yes Sinda Du, MD  ALPRAZolam Duanne Moron) 0.25 MG tablet Take 1 tablet by mouth 2 (two) times daily as needed for anxiety.  07/08/14  Yes [provider]  amLODipine (NORVASC) 5 MG tablet Take 1 tablet by mouth daily. 07/17/14  Yes [provider]  aspirin EC 81 MG EC tablet Take 1 tablet (81 mg total) by mouth daily. 08/19/14  Yes Sinda Du, MD  feeding supplement, ENSURE ENLIVE, (ENSURE ENLIVE) LIQD Take 237 mLs by mouth 2 (two) times daily between meals. 08/19/14  Yes Sinda Du, MD  lisinopril (PRINIVIL,ZESTRIL) 20 MG tablet Take 1 tablet by mouth daily. 07/17/14  Yes [provider]  torsemide (DEMADEX) 10 MG tablet Take 1 tablet by mouth daily. 05/10/14  Yes [provider]    Family History Family History  Problem Relation Age of Onset  . Aneurysm Mother     Social History Social History  Substance Use Topics  . Smoking status: Never Smoker  . Smokeless tobacco: Not on file  . Alcohol use No  Allergies   Patient has no known allergies.   Review of Systems Review of Systems  Constitutional: Negative for appetite change and fatigue.  HENT: Negative for congestion, ear discharge and sinus pressure.   Eyes: Negative for discharge.  Respiratory: Negative for cough.   Cardiovascular: Negative for chest pain.  Gastrointestinal: Negative for abdominal pain and diarrhea.  Genitourinary: Negative for frequency and hematuria.  Musculoskeletal: Negative for back pain.  Skin: Negative for rash.  Neurological:  Negative for seizures and headaches.  Psychiatric/Behavioral: Negative for hallucinations.     Physical Exam Updated Vital Signs BP (!) 157/74 (BP Location: Right Arm)   Pulse 80   Temp 97.7 F (36.5 C) (Oral)   Resp 18   Ht 5\' 5"  (1.651 m)   Wt 49.9 kg (110 lb)   SpO2 99%   BMI 18.30 kg/m   Physical Exam  Constitutional: She appears well-developed.  HENT:  Head: Normocephalic.  Eyes: Conjunctivae and EOM are normal. No scleral icterus.  Neck: Neck supple. No thyromegaly present.  Cardiovascular: Normal rate and regular rhythm.  Exam reveals no gallop and no friction rub.   No murmur heard. Pulmonary/Chest: No stridor. She has no wheezes. She has no rales. She exhibits no tenderness.  Abdominal: She exhibits no distension. There is no tenderness. There is no rebound.  Musculoskeletal: Normal range of motion. She exhibits no edema.  Lymphadenopathy:    She has no cervical adenopathy.  Neurological: She is alert. She exhibits normal muscle tone. Coordination normal.  Patient swallowed answer pointed to person and place  Skin: No rash noted. No erythema.  Psychiatric: She has a normal mood and affect.     ED Treatments / Results  Labs (all labs ordered are listed, but only abnormal results are displayed) Labs Reviewed  COMPREHENSIVE METABOLIC PANEL - Abnormal; Notable for the following:       Result Value   Sodium 131 (*)    Chloride 95 (*)    All other components within normal limits  CBC  URINALYSIS, ROUTINE W REFLEX MICROSCOPIC  CBG MONITORING, ED    EKG  EKG Interpretation None       Radiology Ct Head Wo Contrast  Result Date: 08/31/2016 CLINICAL DATA:  Dementia.  Confusion.  Altered mental status. EXAM: CT HEAD WITHOUT CONTRAST TECHNIQUE: Contiguous axial images were obtained from the base of the skull through the vertex without intravenous contrast. COMPARISON:  Brain MR dated 08/17/2014 and head CT dated 07/03/2006. FINDINGS: Brain: Diffusely enlarged  ventricles and subarachnoid spaces. Patchy white matter low density in both cerebral hemispheres. No intracranial hemorrhage, mass lesion or CT evidence of acute infarction. Vascular: No hyperdense vessel or unexpected calcification. Skull: Bilateral hyperostosis frontalis. Again demonstrated is partial opacification of the left mastoid air cells and middle ear. Sinuses/Orbits: Unremarkable. Other: Bilateral concha bullosa and mild deviation of the midportion of the nasal septum to the right. IMPRESSION: 1. No acute abnormality. 2. Progressive diffuse cerebral and cerebellar atrophy. 3. Progressive chronic small vessel white matter ischemic changes in both cerebral hemispheres. 4. Chronic partial opacification of the left mastoid air cells and middle ear. Electronically Signed   By: Claudie Revering M.D.   On: 08/31/2016 21:00    Procedures Procedures (including critical care time)  Medications Ordered in ED Medications - No data to display   Initial Impression / Assessment and Plan / ED Course  I have reviewed the triage vital signs and the nursing notes.  Pertinent labs & imaging results that were  available during my care of the patient were reviewed by me and considered in my medical decision making (see chart for details).     At discharge patient was back to her normal self. Suspect it is her dementia that affected her. She will follow-up with her doctor  Final Clinical Impressions(s) / ED Diagnoses   Final diagnoses:  Confusion    New Prescriptions New Prescriptions   No medications on file     Milton Ferguson, MD 08/31/16 2200

## 2016-10-16 DIAGNOSIS — Z961 Presence of intraocular lens: Secondary | ICD-10-CM | POA: Diagnosis not present

## 2016-10-16 DIAGNOSIS — H3122 Choroidal dystrophy (central areolar) (generalized) (peripapillary): Secondary | ICD-10-CM | POA: Diagnosis not present

## 2016-10-16 DIAGNOSIS — Z9849 Cataract extraction status, unspecified eye: Secondary | ICD-10-CM | POA: Diagnosis not present

## 2017-01-03 ENCOUNTER — Emergency Department (HOSPITAL_COMMUNITY)
Admission: EM | Admit: 2017-01-03 | Discharge: 2017-01-03 | Disposition: A | Discharge status: Home / Self Care | Payer: Medicare Other | Attending: Emergency Medicine | Admitting: Emergency Medicine

## 2017-01-03 ENCOUNTER — Emergency Department (HOSPITAL_COMMUNITY): Payer: Medicare Other

## 2017-01-03 ENCOUNTER — Encounter (HOSPITAL_COMMUNITY): Payer: Self-pay | Admitting: *Deleted

## 2017-01-03 ENCOUNTER — Other Ambulatory Visit: Payer: Self-pay

## 2017-01-03 DIAGNOSIS — R55 Syncope and collapse: Secondary | ICD-10-CM | POA: Diagnosis not present

## 2017-01-03 DIAGNOSIS — Z79899 Other long term (current) drug therapy: Secondary | ICD-10-CM | POA: Insufficient documentation

## 2017-01-03 DIAGNOSIS — F0390 Unspecified dementia without behavioral disturbance: Secondary | ICD-10-CM | POA: Diagnosis not present

## 2017-01-03 DIAGNOSIS — Z7982 Long term (current) use of aspirin: Secondary | ICD-10-CM | POA: Diagnosis not present

## 2017-01-03 DIAGNOSIS — R410 Disorientation, unspecified: Secondary | ICD-10-CM | POA: Diagnosis not present

## 2017-01-03 DIAGNOSIS — I10 Essential (primary) hypertension: Secondary | ICD-10-CM | POA: Diagnosis not present

## 2017-01-03 LAB — URINALYSIS, ROUTINE W REFLEX MICROSCOPIC
Bilirubin Urine: NEGATIVE
Glucose, UA: NEGATIVE mg/dL
Ketones, ur: NEGATIVE mg/dL
Nitrite: NEGATIVE
PROTEIN: NEGATIVE mg/dL
Specific Gravity, Urine: 1.011 (ref 1.005–1.030)
pH: 7 (ref 5.0–8.0)

## 2017-01-03 LAB — CBC
HEMATOCRIT: 42.8 % (ref 36.0–46.0)
HEMOGLOBIN: 14.5 g/dL (ref 12.0–15.0)
MCH: 30.5 pg (ref 26.0–34.0)
MCHC: 33.9 g/dL (ref 30.0–36.0)
MCV: 89.9 fL (ref 78.0–100.0)
Platelets: 210 10*3/uL (ref 150–400)
RBC: 4.76 MIL/uL (ref 3.87–5.11)
RDW: 12.5 % (ref 11.5–15.5)
WBC: 8.5 10*3/uL (ref 4.0–10.5)

## 2017-01-03 LAB — BASIC METABOLIC PANEL
ANION GAP: 8 (ref 5–15)
BUN: 9 mg/dL (ref 6–20)
CALCIUM: 9.7 mg/dL (ref 8.9–10.3)
CO2: 27 mmol/L (ref 22–32)
Chloride: 95 mmol/L — ABNORMAL LOW (ref 101–111)
Creatinine, Ser: 0.63 mg/dL (ref 0.44–1.00)
GFR calc Af Amer: >60 mL/min (ref >60)
GFR calc non Af Amer: >60 mL/min (ref >60)
GLUCOSE: 111 mg/dL — AB (ref 65–99)
Potassium: 4.2 mmol/L (ref 3.5–5.1)
Sodium: 130 mmol/L — ABNORMAL LOW (ref 135–145)

## 2017-01-03 LAB — I-STAT CG4 LACTIC ACID, ED: LACTIC ACID, VENOUS: 0.71 mmol/L (ref 0.5–1.9)

## 2017-01-03 MED ORDER — SODIUM CHLORIDE 0.9 % IV BOLUS (SEPSIS)
250.0000 mL | Freq: Once | INTRAVENOUS | Status: AC
  Administered 2017-01-03: 250 mL via INTRAVENOUS

## 2017-01-03 NOTE — ED Provider Notes (Signed)
[facility] EMERGENCY DEPARTMENT Provider Note   CSN: [id number] Arrival date & time: 01/03/17  1046     History   Chief Complaint Chief Complaint  Patient presents with  . Weakness    HPI Sara Bowman is a 83 y.o. female.  She presents for evaluation of confusion, headache, nausea, diaphoresis, and foul-smelling urine.  Patient was well yesterday until the evening when she difficult to understand when talking.  This lasted about 3 hours then she became her normal self.  She did not eat much at that time.  Upon awakening today she was alert as usual, but reported a left-sided headache, and then went to the bathroom.  While in the bathroom, she became nauseated and diaphoretic and had several episodes of "heaving."  She did not vomit.  She did not eat breakfast.  No other recent illnesses or evaluations by physicians.  She is reportedly taking her usual medication.  There are no other known modifying factors.  HPI  Past Medical History:  Diagnosis Date  . Cancer ([place])    breast-left  . Confusion   . Dementia   . Hypertension     Patient Active Problem List   Diagnosis Date Noted  . Acute blood loss anemia 10/15/2014  . Intertrochanteric fracture of right femur ([place]) 10/13/2014  . Closed fracture of intertrochanteric section of femur ([place])   . Hip fracture ([place]) 10/12/2014  . Hip fracture, right ([place]) 10/12/2014  . Rhabdomyolysis 10/12/2014  . Malnutrition of moderate degree ([place]) 08/19/2014  . Altered mental status 08/18/2014  . Elevated troponin 08/17/2014  . Altered mental state 08/17/2014  . Physical deconditioning 08/17/2014  . Hyponatremia 08/17/2014  . Essential hypertension 08/17/2014  . Dementia with behavioral disturbance 08/17/2014    Past Surgical History:  Procedure Laterality Date  . ABDOMINAL HYSTERECTOMY    . BLADDER SURGERY     90's  problems with surgery , had procedure undone in [place] 4 days later  . BREAST LUMPECTOMY N/A 1995  .  INTRAMEDULLARY (IM) NAIL INTERTROCHANTERIC Right 10/13/2014   Procedure: INTRAMEDULLARY (IM) NAIL INTERTROCHANTRIC RIGHT HIP;  Surgeon: Carole Civil, MD;  Location: [facility];  Service: Orthopedics;  Laterality: Right;    OB History    No data available       Home Medications    Prior to Admission medications   Medication Sig Start Date End Date Taking? Authorizing Provider  acidophilus (RISAQUAD) CAPS capsule Take 2 capsules by mouth daily. Patient taking differently: Take 1 capsule by mouth daily.  10/15/14  Yes Sinda Du, MD  ALPRAZolam Duanne Moron) 0.25 MG tablet Take 1 tablet by mouth 2 (two) times daily as needed for anxiety.  07/08/14  Yes [provider]  amLODipine (NORVASC) 5 MG tablet Take 1 tablet by mouth daily. 07/17/14  Yes [provider]  aspirin EC 81 MG EC tablet Take 1 tablet (81 mg total) by mouth daily. 08/19/14  Yes Sinda Du, MD  feeding supplement, ENSURE ENLIVE, (ENSURE ENLIVE) LIQD Take 237 mLs by mouth 2 (two) times daily between meals. 08/19/14  Yes Sinda Du, MD  lisinopril (PRINIVIL,ZESTRIL) 20 MG tablet Take 1 tablet by mouth daily. 07/17/14  Yes [provider]    Family History Family History  Problem Relation Age of Onset  . Aneurysm Mother     Social History Social History   Tobacco Use  . Smoking status: Never Smoker  . Smokeless tobacco: Never Used  Substance Use Topics  . Alcohol use: No  .  Drug use: No     Allergies   Patient has no known allergies.   Review of Systems Review of Systems  All other systems reviewed and are negative.    Physical Exam Updated Vital Signs BP 138/78 (BP Location: Right Arm)   Pulse 65   Temp 99.3 F (37.4 C) (Rectal)   Resp 16   Ht 5\' 5"  (1.651 m)   Wt 45.4 kg (100 lb)   SpO2 100%   BMI 16.64 kg/m   Physical Exam  Constitutional: She appears well-developed. No distress.  Elderly, frail  HENT:  Head: Normocephalic and atraumatic.  Eyes:  Conjunctivae and EOM are normal. Pupils are equal, round, and reactive to light.  Neck: Normal range of motion and phonation normal. Neck supple.  Cardiovascular: Normal rate and regular rhythm.  Pulmonary/Chest: Effort normal and breath sounds normal. She exhibits no tenderness.  Abdominal: Soft. She exhibits no distension. There is no tenderness. There is no guarding.  Musculoskeletal: Normal range of motion.  Neurological: She is alert. She exhibits normal muscle tone.  No dysarthria or aphasia.  She is confused.  Skin: Skin is warm and dry.  Psychiatric: She has a normal mood and affect. Her behavior is normal.  Nursing note and vitals reviewed.    ED Treatments / Results  Labs (all labs ordered are listed, but only abnormal results are displayed) Labs Reviewed  BASIC METABOLIC PANEL - Abnormal; Notable for the following components:      Result Value   Sodium 130 (*)    Chloride 95 (*)    Glucose, Bld 111 (*)    All other components within normal limits  URINALYSIS, ROUTINE W REFLEX MICROSCOPIC - Abnormal; Notable for the following components:   Hgb urine dipstick SMALL (*)    Leukocytes, UA TRACE (*)    Bacteria, UA RARE (*)    Squamous Epithelial / LPF 0-5 (*)    All other components within normal limits  CBC  I-STAT CG4 LACTIC ACID, ED  I-STAT CG4 LACTIC ACID, ED    EKG  EKG Interpretation  Date/Time:  Thursday January 03 2017 11:27:38 EST Ventricular Rate:  94 PR Interval:  180 QRS Duration: 76 QT Interval:  332 QTC Calculation: 415 R Axis:   48 Text Interpretation:  Normal sinus rhythm Normal ECG since last tracing no significant change Confirmed by Daleen Bo [id number]) on 01/03/2017 6:14:51 PM       Radiology Ct Head Wo Contrast  Result Date: 01/03/2017 CLINICAL DATA:  Confusion EXAM: CT HEAD WITHOUT CONTRAST TECHNIQUE: Contiguous axial images were obtained from the base of the skull through the vertex without intravenous contrast. COMPARISON:   August 31, 2016 FINDINGS: Brain: There is mild diffuse atrophy. There is no intracranial mass, hemorrhage, extra-axial fluid collection, or midline shift. There is small vessel disease throughout the centra semiovale bilaterally. Small vessel disease is noted in both internal capsules. There is evidence of a prior lacunar infarct in the inferior anterior right centrum semiovale. No new gray-white compartment lesions are identified. There is no evident acute infarct. Vascular: There is no hyperdense vessel. There is calcification in both carotid siphon regions. Skull: The bony calvarium appears intact. Sinuses/Orbits: There is mucosal thickening in several ethmoid air cells bilaterally. There is a concha bullosa on each side, an anatomic variant. Orbits appear symmetric bilaterally. Other: Mastoids on the right are clear. There is chronic opacification of the mastoids on the left. IMPRESSION: Stable atrophy with supratentorial small vessel disease. No acute infarct  evident. No mass or hemorrhage. There is chronic left-sided mastoid air cell disease. There is mucosal thickening in several ethmoid air cells. There are foci of arterial vascular calcification. Electronically Signed   By: Lowella Grip III M.D.   On: 01/03/2017 14:35    Procedures Procedures (including critical care time)  Medications Ordered in ED Medications  sodium chloride 0.9 % bolus 250 mL (0 mLs Intravenous Stopped 01/03/17 1400)     Initial Impression / Assessment and Plan / ED Course  I have reviewed the triage vital signs and the nursing notes.  Pertinent labs & imaging results that were available during my care of the patient were reviewed by me and considered in my medical decision making (see chart for details).      Patient Vitals for the past 24 hrs:  BP Temp Temp src Pulse Resp SpO2 Height Weight  01/03/17 1607 138/78 - - 65 16 100 % - -  01/03/17 1356 125/62 - - 92 18 100 % - -  01/03/17 1323 - 99.3 F (37.4  C) Rectal - - - - -  01/03/17 1122 - - - - - - 5\' 5"  (1.651 m) 45.4 kg (100 lb)  01/03/17 1119 132/74 98.5 F (36.9 C) Oral 100 18 100 % - -    6:12 PM Reevaluation with update and discussion. After initial assessment and treatment, an updated evaluation reveals clinical status unchanged.  Patient has been able to eat and ambulate per her norm.  Findings discussed with daughter and all questions answered. Daleen Bo      Final Clinical Impressions(s) / ED Diagnoses   Final diagnoses:  Near syncope    Nonspecific episode of altered mental status and weakness.  Screening evaluation is reassuring.  Suspect a period of low blood pressure is likely source, for the problem, leading to near syncope.  She has not had a recent illnesses that could potentially be a cause.  She is on 3 blood pressure medicines including a diuretic.  Orthostatics were normal.  Patient may benefit from modification of her medications, with the diuretic being a simple explanation, and likely cause.  Therefore torsemide will be discontinued with close follow-up recommended, with her PCP.  Doubt serious bacterial infection, metabolic instability, pending vascular collapse, CVA or ACS.   Nursing Notes Reviewed/ Care Coordinated Applicable Imaging Reviewed Interpretation of Laboratory Data incorporated into ED treatment  The patient appears reasonably screened and/or stabilized for discharge and I doubt any other medical condition or other [facility] requiring further screening, evaluation, or treatment in the ED at this time prior to discharge.  Plan: Home Medications-continue current medications except torsemide; Home Treatments-rest, fluids, 3 meals a day, assistance when up; return here if the recommended treatment, does not improve the symptoms; Recommended follow up-PCP, follow-up 3 or 4 days.   ED Discharge Orders    None       Daleen Bo, MD 01/03/17 1818

## 2017-01-03 NOTE — Discharge Instructions (Signed)
Testing today is reassuring.  I recommend stopping torsemide, for now, with close follow-up, with her PCP.  In the meantime, try to ensure that she is eating and drinking well.  Also assist her when she gets up to make sure that she does not fall.  If she has periods associated with possible low blood pressure, please lie her flat, to help improve her symptoms.  Return here, if needed, for problems.

## 2017-01-03 NOTE — ED Notes (Signed)
Patient was given peanut butter, crackers, and a Sprite per doctor's request.   We advised patient that once she had eaten we would ambulate her in the hallway.

## 2017-01-03 NOTE — ED Triage Notes (Signed)
Pt's family member c/o confusion that started at 107 yesterday and lasted until 2000 last night. Pt woke up at 0800 this morning c/o left sided headache, diaphoresis and nausea. Family member reports foul smelling urine.

## 2017-01-03 NOTE — ED Notes (Signed)
Patient ambulated to hall from room and back with assist. Tolerated well. EDP to be made aware.

## 2017-03-19 ENCOUNTER — Inpatient Hospital Stay (HOSPITAL_COMMUNITY)
Admission: EM | Admit: 2017-03-19 | Discharge: 2017-03-22 | DRG: 481 | Disposition: A | Discharge status: Skilled Nursing Facility | Payer: Medicare Other | Attending: Internal Medicine | Admitting: Internal Medicine

## 2017-03-19 ENCOUNTER — Inpatient Hospital Stay (HOSPITAL_COMMUNITY): Payer: Medicare Other | Admitting: Anesthesiology

## 2017-03-19 ENCOUNTER — Other Ambulatory Visit: Payer: Self-pay

## 2017-03-19 ENCOUNTER — Inpatient Hospital Stay (HOSPITAL_COMMUNITY): Payer: Medicare Other

## 2017-03-19 ENCOUNTER — Encounter (HOSPITAL_COMMUNITY)
Admission: EM | Disposition: A | Discharge status: Skilled Nursing Facility | Payer: Self-pay | Source: Home / Self Care | Attending: Internal Medicine

## 2017-03-19 ENCOUNTER — Emergency Department (HOSPITAL_COMMUNITY): Payer: Medicare Other

## 2017-03-19 ENCOUNTER — Telehealth: Payer: Self-pay | Admitting: Orthopedic Surgery

## 2017-03-19 ENCOUNTER — Encounter (HOSPITAL_COMMUNITY): Payer: Self-pay | Admitting: Emergency Medicine

## 2017-03-19 DIAGNOSIS — R2681 Unsteadiness on feet: Secondary | ICD-10-CM | POA: Diagnosis present

## 2017-03-19 DIAGNOSIS — W1830XA Fall on same level, unspecified, initial encounter: Secondary | ICD-10-CM | POA: Diagnosis present

## 2017-03-19 DIAGNOSIS — Z90710 Acquired absence of both cervix and uterus: Secondary | ICD-10-CM | POA: Diagnosis not present

## 2017-03-19 DIAGNOSIS — G309 Alzheimer's disease, unspecified: Secondary | ICD-10-CM | POA: Diagnosis not present

## 2017-03-19 DIAGNOSIS — F03918 Unspecified dementia, unspecified severity, with other behavioral disturbance: Secondary | ICD-10-CM | POA: Diagnosis present

## 2017-03-19 DIAGNOSIS — Z6820 Body mass index (BMI) 20.0-20.9, adult: Secondary | ICD-10-CM

## 2017-03-19 DIAGNOSIS — Z7982 Long term (current) use of aspirin: Secondary | ICD-10-CM

## 2017-03-19 DIAGNOSIS — F0391 Unspecified dementia with behavioral disturbance: Secondary | ICD-10-CM | POA: Diagnosis present

## 2017-03-19 DIAGNOSIS — F0390 Unspecified dementia without behavioral disturbance: Secondary | ICD-10-CM | POA: Diagnosis present

## 2017-03-19 DIAGNOSIS — S72142A Displaced intertrochanteric fracture of left femur, initial encounter for closed fracture: Principal | ICD-10-CM | POA: Diagnosis present

## 2017-03-19 DIAGNOSIS — E871 Hypo-osmolality and hyponatremia: Secondary | ICD-10-CM | POA: Diagnosis present

## 2017-03-19 DIAGNOSIS — W19XXXA Unspecified fall, initial encounter: Secondary | ICD-10-CM

## 2017-03-19 DIAGNOSIS — I119 Hypertensive heart disease without heart failure: Secondary | ICD-10-CM | POA: Diagnosis present

## 2017-03-19 DIAGNOSIS — E440 Moderate protein-calorie malnutrition: Secondary | ICD-10-CM | POA: Diagnosis present

## 2017-03-19 DIAGNOSIS — S72002A Fracture of unspecified part of neck of left femur, initial encounter for closed fracture: Secondary | ICD-10-CM | POA: Diagnosis not present

## 2017-03-19 DIAGNOSIS — D62 Acute posthemorrhagic anemia: Secondary | ICD-10-CM | POA: Diagnosis not present

## 2017-03-19 DIAGNOSIS — Z9889 Other specified postprocedural states: Secondary | ICD-10-CM

## 2017-03-19 DIAGNOSIS — S7290XA Unspecified fracture of unspecified femur, initial encounter for closed fracture: Secondary | ICD-10-CM

## 2017-03-19 DIAGNOSIS — E860 Dehydration: Secondary | ICD-10-CM | POA: Diagnosis present

## 2017-03-19 DIAGNOSIS — F0281 Dementia in other diseases classified elsewhere with behavioral disturbance: Secondary | ICD-10-CM | POA: Diagnosis not present

## 2017-03-19 DIAGNOSIS — I10 Essential (primary) hypertension: Secondary | ICD-10-CM

## 2017-03-19 DIAGNOSIS — W19XXXD Unspecified fall, subsequent encounter: Secondary | ICD-10-CM | POA: Diagnosis not present

## 2017-03-19 DIAGNOSIS — R269 Unspecified abnormalities of gait and mobility: Secondary | ICD-10-CM | POA: Diagnosis present

## 2017-03-19 DIAGNOSIS — R5381 Other malaise: Secondary | ICD-10-CM | POA: Diagnosis present

## 2017-03-19 DIAGNOSIS — Z79899 Other long term (current) drug therapy: Secondary | ICD-10-CM

## 2017-03-19 DIAGNOSIS — Z8781 Personal history of (healed) traumatic fracture: Secondary | ICD-10-CM

## 2017-03-19 HISTORY — PX: INTRAMEDULLARY (IM) NAIL INTERTROCHANTERIC: SHX5875

## 2017-03-19 LAB — COMPREHENSIVE METABOLIC PANEL
ALT: 16 U/L (ref 14–54)
AST: 23 U/L (ref 15–41)
Albumin: 3.9 g/dL (ref 3.5–5.0)
Alkaline Phosphatase: 72 U/L (ref 38–126)
Anion gap: 8 (ref 5–15)
BILIRUBIN TOTAL: 0.6 mg/dL (ref 0.3–1.2)
BUN: 11 mg/dL (ref 6–20)
CHLORIDE: 96 mmol/L — AB (ref 101–111)
CO2: 27 mmol/L (ref 22–32)
Calcium: 9.5 mg/dL (ref 8.9–10.3)
Creatinine, Ser: 0.58 mg/dL (ref 0.44–1.00)
Glucose, Bld: 106 mg/dL — ABNORMAL HIGH (ref 65–99)
POTASSIUM: 3.8 mmol/L (ref 3.5–5.1)
Sodium: 131 mmol/L — ABNORMAL LOW (ref 135–145)
TOTAL PROTEIN: 6.7 g/dL (ref 6.5–8.1)

## 2017-03-19 LAB — CBC WITH DIFFERENTIAL/PLATELET
Basophils Absolute: 0 10*3/uL (ref 0.0–0.1)
Basophils Relative: 1 %
EOS PCT: 1 %
Eosinophils Absolute: 0.1 10*3/uL (ref 0.0–0.7)
HEMATOCRIT: 39.3 % (ref 36.0–46.0)
Hemoglobin: 13.2 g/dL (ref 12.0–15.0)
LYMPHS ABS: 1.1 10*3/uL (ref 0.7–4.0)
LYMPHS PCT: 23 %
MCH: 29.9 pg (ref 26.0–34.0)
MCHC: 33.6 g/dL (ref 30.0–36.0)
MCV: 88.9 fL (ref 78.0–100.0)
MONO ABS: 0.3 10*3/uL (ref 0.1–1.0)
MONOS PCT: 6 %
Neutro Abs: 3.5 10*3/uL (ref 1.7–7.7)
Neutrophils Relative %: 69 %
PLATELETS: 187 10*3/uL (ref 150–400)
RBC: 4.42 MIL/uL (ref 3.87–5.11)
RDW: 12.5 % (ref 11.5–15.5)
WBC: 5 10*3/uL (ref 4.0–10.5)

## 2017-03-19 LAB — CREATININE, SERUM: CREATININE: 0.53 mg/dL (ref 0.44–1.00)

## 2017-03-19 LAB — CBC
HCT: 27.7 % — ABNORMAL LOW (ref 36.0–46.0)
Hemoglobin: 9.7 g/dL — ABNORMAL LOW (ref 12.0–15.0)
MCH: 30.8 pg (ref 26.0–34.0)
MCHC: 35 g/dL (ref 30.0–36.0)
MCV: 87.9 fL (ref 78.0–100.0)
PLATELETS: 146 10*3/uL — AB (ref 150–400)
RBC: 3.15 MIL/uL — AB (ref 3.87–5.11)
RDW: 12.8 % (ref 11.5–15.5)
WBC: 9.3 10*3/uL (ref 4.0–10.5)

## 2017-03-19 LAB — ABO/RH: ABO/RH(D): O POS

## 2017-03-19 SURGERY — FIXATION, FRACTURE, INTERTROCHANTERIC, WITH INTRAMEDULLARY ROD
Anesthesia: Monitor Anesthesia Care | Laterality: Left

## 2017-03-19 MED ORDER — ENOXAPARIN SODIUM 40 MG/0.4ML ~~LOC~~ SOLN: 40.0000 mg | SUBCUTANEOUS | 0 refills | Status: AC

## 2017-03-19 MED ORDER — HYDROCODONE-ACETAMINOPHEN 5-325 MG PO TABS
1.0000 | ORAL_TABLET | Freq: Four times a day (QID) | ORAL | Status: DC | PRN
  Administered 2017-03-20: 2 via ORAL
  Administered 2017-03-20: 1 via ORAL
  Filled 2017-03-19 (×2): qty 2

## 2017-03-19 MED ORDER — LACTATED RINGERS IV SOLN
INTRAVENOUS | Status: DC | PRN
  Administered 2017-03-19: 17:00:00 via INTRAVENOUS

## 2017-03-19 MED ORDER — MENTHOL 3 MG MT LOZG: 1.0000 | LOZENGE | OROMUCOSAL | Status: DC | PRN

## 2017-03-19 MED ORDER — ACETAMINOPHEN 325 MG PO TABS
650.0000 mg | ORAL_TABLET | Freq: Four times a day (QID) | ORAL | Status: DC | PRN
  Administered 2017-03-20 – 2017-03-21 (×2): 650 mg via ORAL
  Filled 2017-03-19 (×2): qty 2

## 2017-03-19 MED ORDER — MORPHINE SULFATE (PF) 2 MG/ML IV SOLN: 2.0000 mg | INTRAVENOUS | Status: DC | PRN

## 2017-03-19 MED ORDER — LORAZEPAM 2 MG/ML IJ SOLN
0.5000 mg | Freq: Four times a day (QID) | INTRAMUSCULAR | Status: DC | PRN
  Administered 2017-03-20: 0.5 mg via INTRAVENOUS
  Filled 2017-03-19: qty 1

## 2017-03-19 MED ORDER — PROPOFOL 500 MG/50ML IV EMUL
INTRAVENOUS | Status: DC | PRN
  Administered 2017-03-19: 25 ug/kg/min via INTRAVENOUS

## 2017-03-19 MED ORDER — HYDRALAZINE HCL 20 MG/ML IJ SOLN: 5.0000 mg | INTRAMUSCULAR | Status: DC | PRN

## 2017-03-19 MED ORDER — CEFAZOLIN SODIUM-DEXTROSE 2-4 GM/100ML-% IV SOLN
2.0000 g | INTRAVENOUS | Status: AC
  Administered 2017-03-19: 2 g via INTRAVENOUS

## 2017-03-19 MED ORDER — ONDANSETRON HCL 4 MG PO TABS: 4.0000 mg | ORAL_TABLET | Freq: Four times a day (QID) | ORAL | Status: DC | PRN

## 2017-03-19 MED ORDER — AMLODIPINE BESYLATE 5 MG PO TABS
5.0000 mg | ORAL_TABLET | Freq: Every day | ORAL | Status: DC
  Administered 2017-03-19 – 2017-03-20 (×2): 5 mg via ORAL
  Filled 2017-03-19 (×2): qty 1

## 2017-03-19 MED ORDER — SODIUM CHLORIDE 0.9 % IV SOLN
75.0000 mL/h | INTRAVENOUS | Status: DC
  Administered 2017-03-19 – 2017-03-20 (×2): 75 mL/h via INTRAVENOUS

## 2017-03-19 MED ORDER — ALBUMIN HUMAN 5 % IV SOLN
INTRAVENOUS | Status: DC | PRN
  Administered 2017-03-19: 18:00:00 via INTRAVENOUS

## 2017-03-19 MED ORDER — HYDROCODONE-ACETAMINOPHEN 5-325 MG PO TABS: 1.0000 | ORAL_TABLET | Freq: Four times a day (QID) | ORAL | Status: DC | PRN

## 2017-03-19 MED ORDER — FENTANYL CITRATE (PF) 100 MCG/2ML IJ SOLN
25.0000 ug | Freq: Once | INTRAMUSCULAR | Status: AC
  Administered 2017-03-19: 25 ug via INTRAVENOUS
  Filled 2017-03-19: qty 2

## 2017-03-19 MED ORDER — PHENOL 1.4 % MT LIQD: 1.0000 | OROMUCOSAL | Status: DC | PRN

## 2017-03-19 MED ORDER — LISINOPRIL 20 MG PO TABS
20.0000 mg | ORAL_TABLET | Freq: Every day | ORAL | Status: DC
  Administered 2017-03-19 – 2017-03-20 (×2): 20 mg via ORAL
  Filled 2017-03-19 (×2): qty 1

## 2017-03-19 MED ORDER — RISAQUAD PO CAPS
1.0000 | ORAL_CAPSULE | Freq: Every day | ORAL | Status: DC
  Administered 2017-03-19 – 2017-03-22 (×4): 1 via ORAL
  Filled 2017-03-19 (×4): qty 1

## 2017-03-19 MED ORDER — SENNA 8.6 MG PO TABS
1.0000 | ORAL_TABLET | Freq: Two times a day (BID) | ORAL | Status: DC
  Administered 2017-03-19 – 2017-03-22 (×5): 8.6 mg via ORAL
  Filled 2017-03-19 (×5): qty 1

## 2017-03-19 MED ORDER — ENOXAPARIN SODIUM 40 MG/0.4ML ~~LOC~~ SOLN
40.0000 mg | SUBCUTANEOUS | Status: DC
  Administered 2017-03-20 – 2017-03-22 (×3): 40 mg via SUBCUTANEOUS
  Filled 2017-03-19 (×3): qty 0.4

## 2017-03-19 MED ORDER — ENSURE ENLIVE PO LIQD
237.0000 mL | Freq: Two times a day (BID) | ORAL | Status: DC
  Administered 2017-03-20 – 2017-03-22 (×4): 237 mL via ORAL

## 2017-03-19 MED ORDER — FENTANYL CITRATE (PF) 250 MCG/5ML IJ SOLN
INTRAMUSCULAR | Status: AC
  Filled 2017-03-19: qty 5

## 2017-03-19 MED ORDER — SODIUM CHLORIDE 0.9 % IV SOLN
Freq: Once | INTRAVENOUS | Status: AC
  Administered 2017-03-19: 09:00:00 via INTRAVENOUS

## 2017-03-19 MED ORDER — ACETAMINOPHEN 325 MG PO TABS: 650.0000 mg | ORAL_TABLET | Freq: Four times a day (QID) | ORAL | 1 refills | Status: AC | PRN

## 2017-03-19 MED ORDER — ALUM & MAG HYDROXIDE-SIMETH 200-200-20 MG/5ML PO SUSP: 30.0000 mL | ORAL | Status: DC | PRN

## 2017-03-19 MED ORDER — DOCUSATE SODIUM 100 MG PO CAPS
100.0000 mg | ORAL_CAPSULE | Freq: Two times a day (BID) | ORAL | Status: DC
  Administered 2017-03-19 – 2017-03-22 (×5): 100 mg via ORAL
  Filled 2017-03-19 (×5): qty 1

## 2017-03-19 MED ORDER — MAGNESIUM CITRATE PO SOLN: 1.0000 | Freq: Once | ORAL | Status: DC | PRN

## 2017-03-19 MED ORDER — CEFAZOLIN SODIUM-DEXTROSE 2-4 GM/100ML-% IV SOLN
INTRAVENOUS | Status: AC
  Filled 2017-03-19: qty 100

## 2017-03-19 MED ORDER — BISACODYL 10 MG RE SUPP: 10.0000 mg | Freq: Every day | RECTAL | Status: DC | PRN

## 2017-03-19 MED ORDER — BUPIVACAINE IN DEXTROSE 0.75-8.25 % IT SOLN
INTRATHECAL | Status: DC | PRN
  Administered 2017-03-19: 1.6 mL via INTRATHECAL

## 2017-03-19 MED ORDER — SENNA-DOCUSATE SODIUM 8.6-50 MG PO TABS: 2.0000 | ORAL_TABLET | Freq: Every day | ORAL | 1 refills | Status: AC

## 2017-03-19 MED ORDER — SODIUM CHLORIDE 0.9 % IV SOLN: INTRAVENOUS | Status: DC

## 2017-03-19 MED ORDER — CEFAZOLIN SODIUM-DEXTROSE 2-4 GM/100ML-% IV SOLN
2.0000 g | Freq: Four times a day (QID) | INTRAVENOUS | Status: AC
  Administered 2017-03-19 – 2017-03-20 (×2): 2 g via INTRAVENOUS
  Filled 2017-03-19 (×2): qty 100

## 2017-03-19 MED ORDER — POVIDONE-IODINE 10 % EX SWAB: 2.0000 "application " | Freq: Once | CUTANEOUS | Status: DC

## 2017-03-19 MED ORDER — SENNOSIDES-DOCUSATE SODIUM 8.6-50 MG PO TABS: 1.0000 | ORAL_TABLET | Freq: Every evening | ORAL | Status: DC | PRN

## 2017-03-19 MED ORDER — ONDANSETRON HCL 4 MG/2ML IJ SOLN
4.0000 mg | Freq: Once | INTRAMUSCULAR | Status: AC
  Administered 2017-03-19: 4 mg via INTRAVENOUS
  Filled 2017-03-19: qty 2

## 2017-03-19 MED ORDER — LACTATED RINGERS IV SOLN
INTRAVENOUS | Status: DC
  Administered 2017-03-19: 17:00:00 via INTRAVENOUS

## 2017-03-19 MED ORDER — MORPHINE SULFATE (PF) 2 MG/ML IV SOLN: 0.5000 mg | INTRAVENOUS | Status: DC | PRN

## 2017-03-19 MED ORDER — ENOXAPARIN SODIUM 40 MG/0.4ML ~~LOC~~ SOLN: 40.0000 mg | SUBCUTANEOUS | Status: DC

## 2017-03-19 MED ORDER — FERROUS SULFATE 325 (65 FE) MG PO TABS
325.0000 mg | ORAL_TABLET | Freq: Three times a day (TID) | ORAL | Status: DC
  Administered 2017-03-20 – 2017-03-22 (×8): 325 mg via ORAL
  Filled 2017-03-19 (×8): qty 1

## 2017-03-19 MED ORDER — CHLORHEXIDINE GLUCONATE 4 % EX LIQD: 60.0000 mL | Freq: Once | CUTANEOUS | Status: DC

## 2017-03-19 MED ORDER — ACETAMINOPHEN 650 MG RE SUPP: 650.0000 mg | Freq: Four times a day (QID) | RECTAL | Status: DC | PRN

## 2017-03-19 MED ORDER — POLYETHYLENE GLYCOL 3350 17 G PO PACK
17.0000 g | PACK | Freq: Every day | ORAL | Status: DC | PRN
  Administered 2017-03-20: 17 g via ORAL
  Filled 2017-03-19: qty 1

## 2017-03-19 MED ORDER — PHENYLEPHRINE HCL 10 MG/ML IJ SOLN
INTRAVENOUS | Status: DC | PRN
  Administered 2017-03-19: 50 ug/min via INTRAVENOUS

## 2017-03-19 MED ORDER — ONDANSETRON HCL 4 MG/2ML IJ SOLN: 4.0000 mg | Freq: Four times a day (QID) | INTRAMUSCULAR | Status: DC | PRN

## 2017-03-19 SURGICAL SUPPLY — 42 items
APL SKNCLS STERI-STRIP NONHPOA (GAUZE/BANDAGES/DRESSINGS) ×1
BENZOIN TINCTURE PRP APPL 2/3 (GAUZE/BANDAGES/DRESSINGS) ×3 IMPLANT
BIT DRILL 4.3MMS DISTAL GRDTED (BIT) IMPLANT
BOOTCOVER CLEANROOM LRG (PROTECTIVE WEAR) ×6 IMPLANT
CLOSURE STERI-STRIP 1/2X4 (GAUZE/BANDAGES/DRESSINGS) ×1
CLOSURE STERI-STRIP 1/4X4 (GAUZE/BANDAGES/DRESSINGS) ×2 IMPLANT
CLSR STERI-STRIP ANTIMIC 1/2X4 (GAUZE/BANDAGES/DRESSINGS) ×2 IMPLANT
COVER PERINEAL POST (MISCELLANEOUS) ×3 IMPLANT
COVER SURGICAL LIGHT HANDLE (MISCELLANEOUS) ×3 IMPLANT
DRAPE STERI IOBAN 125X83 (DRAPES) ×3 IMPLANT
DRILL 4.3MMS DISTAL GRADUATED (BIT) ×3
DRSG MEPILEX BORDER 4X4 (GAUZE/BANDAGES/DRESSINGS) ×6 IMPLANT
DRSG MEPILEX BORDER 4X8 (GAUZE/BANDAGES/DRESSINGS) ×2 IMPLANT
DURAPREP 26ML APPLICATOR (WOUND CARE) ×3 IMPLANT
ELECT CAUTERY BLADE 6.4 (BLADE) ×3 IMPLANT
ELECT REM PT RETURN 9FT ADLT (ELECTROSURGICAL) ×3
ELECTRODE REM PT RTRN 9FT ADLT (ELECTROSURGICAL) ×1 IMPLANT
EVACUATOR 1/8 PVC DRAIN (DRAIN) IMPLANT
FACESHIELD WRAPAROUND (MASK) ×6 IMPLANT
FACESHIELD WRAPAROUND OR TEAM (MASK) ×2 IMPLANT
GAUZE XEROFORM 5X9 LF (GAUZE/BANDAGES/DRESSINGS) ×3 IMPLANT
GLOVE BIOGEL PI ORTHO PRO SZ8 (GLOVE) ×4
GLOVE ORTHO TXT STRL SZ7.5 (GLOVE) ×6 IMPLANT
GLOVE PI ORTHO PRO STRL SZ8 (GLOVE) ×2 IMPLANT
GLOVE SURG ORTHO 8.0 STRL STRW (GLOVE) ×6 IMPLANT
GOWN STRL REUS W/ TWL LRG LVL3 (GOWN DISPOSABLE) IMPLANT
GOWN STRL REUS W/TWL LRG LVL3 (GOWN DISPOSABLE)
GUIDEPIN 3.2X17.5 THRD DISP (PIN) ×2 IMPLANT
GUIDEWIRE BALL NOSE 80CM (WIRE) ×2 IMPLANT
HFN LH 130 DEG 11MM X 340MM (Nail) ×2 IMPLANT
KIT ROOM TURNOVER OR (KITS) ×3 IMPLANT
LINER BOOT UNIVERSAL DISP (MISCELLANEOUS) ×3 IMPLANT
MANIFOLD NEPTUNE II (INSTRUMENTS) ×3 IMPLANT
NS IRRIG 1000ML POUR BTL (IV SOLUTION) ×3 IMPLANT
PACK GENERAL/GYN (CUSTOM PROCEDURE TRAY) ×3 IMPLANT
PAD ARMBOARD 7.5X6 YLW CONV (MISCELLANEOUS) ×6 IMPLANT
SCREW LAG HIP NAIL 10.5X95 (Screw) ×2 IMPLANT
SUT VIC AB 0 CTB1 27 (SUTURE) ×3 IMPLANT
SUT VIC AB 3-0 SH 8-18 (SUTURE) ×3 IMPLANT
TOWEL OR 17X24 6PK STRL BLUE (TOWEL DISPOSABLE) ×3 IMPLANT
TOWEL OR 17X26 10 PK STRL BLUE (TOWEL DISPOSABLE) ×3 IMPLANT
WATER STERILE IRR 1000ML POUR (IV SOLUTION) ×3 IMPLANT

## 2017-03-19 NOTE — Anesthesia Procedure Notes (Signed)
Spinal  Patient location during procedure: OR Start time: 03/19/2017 5:46 PM End time: 03/19/2017 5:53 PM Staffing Anesthesiologist: Oleta Mouse, MD Preanesthetic Checklist Completed: patient identified, surgical consent, pre-op evaluation, timeout performed, IV checked, risks and benefits discussed and monitors and equipment checked Spinal Block Patient position: left lateral decubitus Prep: ChloraPrep and site prepped and draped Patient monitoring: heart rate, cardiac monitor, continuous pulse ox and blood pressure Approach: midline Location: L3-4 Injection technique: single-shot Needle Needle type: Pencan  Needle gauge: 24 G Needle length: 10 cm Assessment Sensory level: T6

## 2017-03-19 NOTE — Op Note (Signed)
DATE OF SURGERY:  03/19/2017  TIME: 7:01 PM  PATIENT NAME:  Sara Bowman  AGE: 84 y.o.  PRE-OPERATIVE DIAGNOSIS:  LEFT intertrochanteric hip fracture  POST-OPERATIVE DIAGNOSIS:  SAME  PROCEDURE: Left hip intramedullary nail  SURGEON:  Johnny Bridge  ASSISTANT:  Joya Gaskins, OPA-C, present and scrubbed throughout the case, critical for assistance with exposure, retraction, instrumentation, and closure.  OPERATIVE IMPLANTS: Biomet Affixus size 340 x 11 femoral nail with a cephalomedullary lag screw for the femoral head size 95.  UNIQUE ASPECTS OF THE CASE: Extremely poor quality bone, the guidepin advanced through the femoral head, and I placed the guidepin down the femur and into the greater trochanteric my hand.  ESTIMATED BLOOD LOSS: 75 mL  PREOPERATIVE INDICATIONS:  [name] is a 84 y.o. year old who fell and suffered a hip fracture. She was brought into the ER at [facility], transferred, and then admitted and optimized and then elected for surgical intervention.  She has advanced dementia, and her daughter was here to help assist with decision-making.  The risks benefits and alternatives were discussed with the patient and the daughter including but not limited to the risks of nonoperative treatment, versus surgical intervention including infection, bleeding, nerve injury, malunion, nonunion, hardware prominence, hardware failure, need for hardware removal, blood clots, cardiopulmonary complications, morbidity, mortality, among others, and they were willing to proceed.    OPERATIVE PROCEDURE:  The patient was brought to the operating room and placed in the supine position.  Spinal anesthesia was administered. She was placed on the fracture table.  Closed reduction was performed under C-arm guidance.  Time out was then performed after sterile prep and drape. She received preoperative antibiotics.  Incision was made proximal to the greater trochanter. A  guidewire was placed in the appropriate position. Confirmation was made on AP and lateral views. The length of the femur was also measured using fluoroscopy measuring from the tip of the trochanter to the upper pole of the patella.    The above-named nail was opened. I opened the proximal femur with a reamer. I then placed the nail by hand down. I did not need to ream the femur.  Once the nail was completely seated, I placed a guidepin into the femoral head into the center center position. I measured the length, and then reamed the lateral cortex and up into the head. I then placed the cephalomedullary screw. Slight compression was applied. Anatomic fixation achieved. Bone quality was mediocre.  I then secured the proximal interlocking bolt, and then removed the instruments, and took final C-arm pictures AP and lateral the entire length of the leg.   Anatomic reconstruction was achieved, and the wounds were irrigated copiously and closed with Vicryl followed by Steri-Strips and sterile gauze for the skin. The patient was awakened and returned to PACU in stable and satisfactory condition. There were no complications and the patient tolerated the procedure well.  She will be weightbearing as tolerated, and will be on Lovenox  for a period of three weeks after discharge.   Marchia Bond, M.D.

## 2017-03-19 NOTE — ED Triage Notes (Addendum)
Pt was walking with daughter. Pt tripped and fell on grass.  Pain to left hip.  Denies hitting head. Pedal pulses present. Abnormality noted to left hip.

## 2017-03-19 NOTE — Transfer of Care (Signed)
Immediate Anesthesia Transfer of Care Note  Patient: KAYLEA MOUNSEY  Procedure(s) Performed: INTRAMEDULLARY (IM) NAIL INTERTROCHANTRIC (Left )  Patient Location: PACU  Anesthesia Type:Spinal  Level of Consciousness: sedated  Airway & Oxygen Therapy: Patient Spontanous Breathing and Patient connected to nasal cannula oxygen  Post-op Assessment: Report given to RN and Post -op Vital signs reviewed and stable  Post vital signs: Reviewed and stable  Last Vitals:  Vitals:   03/19/17 1500 03/19/17 1909  BP: (!) 139/101 (!) 149/126  Pulse:  87  Resp: 16 19  Temp:  (!) 36.2 C  SpO2:      Last Pain:  Vitals:   03/19/17 0747  TempSrc: Oral  PainSc:          Complications: No apparent anesthesia complications

## 2017-03-19 NOTE — Telephone Encounter (Signed)
Call from [place] at [facility] Emergency room, for provider, Dr Lita Mains - direct ph# [phone]; requests to speak with Dr Aline Brochure regarding patient and new injury left hip, previously treated by Dr Aline Brochure for right hip.  States has paged.  Relayed Dr Aline Brochure is in surgery.

## 2017-03-19 NOTE — Anesthesia Postprocedure Evaluation (Addendum)
Anesthesia Post Note  Patient: Sara Bowman  Procedure(s) Performed: INTRAMEDULLARY (IM) NAIL INTERTROCHANTRIC (Left )     Patient location during evaluation: PACU Anesthesia Type: Spinal Level of consciousness: oriented and awake and alert Pain management: pain level controlled Vital Signs Assessment: post-procedure vital signs reviewed and stable Respiratory status: spontaneous breathing, respiratory function stable and patient connected to nasal cannula oxygen Cardiovascular status: blood pressure returned to baseline and stable Postop Assessment: no headache, no backache, no apparent nausea or vomiting and spinal receding Anesthetic complications: no    Last Vitals:  Vitals:   03/19/17 1945 03/19/17 2000  BP: 108/77 (!) 125/59  Pulse: 94 95  Resp: 17 18  Temp:  36.7 C  SpO2: 100% 100%    Last Pain:  Vitals:   03/19/17 2000  TempSrc: Axillary  PainSc:                  [name],[name]

## 2017-03-19 NOTE — ED Notes (Signed)
Pt incontinent of urine.  Pt cleaned up and pure wick readjusted.  Pt c/o nausea.

## 2017-03-19 NOTE — Anesthesia Postprocedure Evaluation (Signed)
Anesthesia Post Note  Patient: Sara Bowman  Procedure(s) Performed: INTRAMEDULLARY (IM) NAIL INTERTROCHANTRIC (Left )     Anesthesia Type: Spinal    Last Vitals:  Vitals:   03/19/17 1945 03/19/17 2000  BP: 108/77 (!) 125/59  Pulse: 94 95  Resp: 17 18  Temp:  36.7 C  SpO2: 100% 100%    Last Pain:  Vitals:   03/19/17 2000  TempSrc: Axillary  PainSc:                  Valentine Barney,[name]

## 2017-03-19 NOTE — Anesthesia Preprocedure Evaluation (Addendum)
Anesthesia Evaluation  Patient identified by MRN, date of birth, ID band Patient awake    Reviewed: Allergy & Precautions, NPO status , Patient's Chart, lab work & pertinent test results  History of Anesthesia Complications Negative for: history of anesthetic complications  Airway Mallampati: II  TM Distance: >3 FB Neck ROM: Full    Dental  (+) Dental Advisory Given   Pulmonary neg pulmonary ROS,    breath sounds clear to auscultation       Cardiovascular hypertension, Pt. on medications  Rhythm:Regular  ECG: SR, rate 77   Neuro/Psych PSYCHIATRIC DISORDERS Dementia Dementia    GI/Hepatic negative GI ROS, Neg liver ROS,   Endo/Other  negative endocrine ROS  Renal/GU negative Renal ROS     Musculoskeletal negative musculoskeletal ROS (+)   Abdominal   Peds  Hematology negative hematology ROS (+)   Anesthesia Other Findings  LEFT INTERTROCHIC NAIL  Reproductive/Obstetrics                            Anesthesia Physical Anesthesia Plan  ASA: III and emergent  Anesthesia Plan: Spinal and MAC   Post-op Pain Management:    Induction: Intravenous  PONV Risk Score and Plan: 2 and Ondansetron and Treatment may vary due to age or medical condition  Airway Management Planned: Nasal Cannula  Additional Equipment: None  Intra-op Plan:   Post-operative Plan:   Informed Consent: I have reviewed the patients History and Physical, chart, labs and discussed the procedure including the risks, benefits and alternatives for the proposed anesthesia with the patient or authorized representative who has indicated his/her understanding and acceptance.   Dental advisory given and Consent reviewed with POA  Plan Discussed with: CRNA and Surgeon  Anesthesia Plan Comments:        Anesthesia Quick Evaluation

## 2017-03-19 NOTE — ED Notes (Signed)
Pt states she needs to go to the bathroom.  Pure wick placed and explained to pt and daughter.

## 2017-03-19 NOTE — H&P (Signed)
History and Physical  Sara Bowman QQI:[id number] DOB: Jul 01, 1923 DOA: 03/19/2017  Referring physician: Lita Mains MD PCP: Sinda Du, MD   Chief Complaint: Fall   Historian: Pt has severe dementia and unable to give history.  Information taken from family, ED provider and EMS records.   HPI: Sara Bowman is a 84 y.o. demented female presented to ED by EMS with daughter after a witnessed fall at home.  According to records, the patient was seen falling down in yard at home while walking in the grass. She fell and landed on her left hip and was unable to get up off the ground after the fall.  She did complain of pain. There was no syncope and daughter denies that the patient hit her head.    Of note, patient had fallen and broken her right hip about 3 years ago and this was repaired by Dr. Aline Brochure.  The only complaint that the patient has is of left hip pain.    ED Course:  The patient was seen by ED provider and her left hip xray was positive for acute comminuted and mildly displaced left intertrochanteric fracture.  She was mildly hyponatremic with sodium of 131.  Her CXR did not show acute findings but chronic findings.   There is no ortho coverage at [facility] today.  Ortho at [facility] Dr. Mardelle Matte was consulted and will see the patient at [facility] and requested patient remain NPO until she is evaluated at [facility] for possible surgery later today.      Review of Systems: UTO due to dementia  Past Medical History:  Diagnosis Date  . Cancer ([employer])    breast-left  . Confusion   . Dementia   . Hypertension    Past Surgical History:  Procedure Laterality Date  . ABDOMINAL HYSTERECTOMY    . BLADDER SURGERY     90's  problems with surgery , had procedure undone in [place] 4 days later  . BREAST LUMPECTOMY N/A 1995  . INTRAMEDULLARY (IM) NAIL INTERTROCHANTERIC Right 10/13/2014   Procedure: INTRAMEDULLARY (IM) NAIL INTERTROCHANTRIC RIGHT HIP;  Surgeon: Carole Civil, MD;  Location: [facility];  Service: Orthopedics;  Laterality: Right;   Social History:  reports that  has never smoked. she has never used smokeless tobacco. She reports that she does not drink alcohol or use drugs.  No Known Allergies  Family History  Problem Relation Age of Onset  . Aneurysm Mother     Prior to Admission medications   Medication Sig Start Date End Date Taking? Authorizing Provider  acidophilus (RISAQUAD) CAPS capsule Take 2 capsules by mouth daily. Patient taking differently: Take 1 capsule by mouth daily.  10/15/14  Yes Sinda Du, MD  ALPRAZolam Duanne Moron) 0.25 MG tablet Take 1 tablet by mouth 2 (two) times daily as needed for anxiety.  07/08/14  Yes [provider]  amLODipine (NORVASC) 5 MG tablet Take 1 tablet by mouth daily. 07/17/14  Yes [provider]  aspirin EC 81 MG EC tablet Take 1 tablet (81 mg total) by mouth daily. 08/19/14  Yes Sinda Du, MD  feeding supplement, ENSURE ENLIVE, (ENSURE ENLIVE) LIQD Take 237 mLs by mouth 2 (two) times daily between meals. 08/19/14  Yes Sinda Du, MD  lisinopril (PRINIVIL,ZESTRIL) 20 MG tablet Take 1 tablet by mouth daily. 07/17/14  Yes [provider]   Physical Exam: Vitals:   03/19/17 1230 03/19/17 1300 03/19/17 1330 03/19/17 1400  BP: (!) 161/76 129/67  130/67 (!) 154/76  Pulse:  80 72 77  Resp: 19 16 16 16   Temp:      TempSrc:      SpO2:  95% 97% 99%   Constitutional: She appears well-developed and well-nourished. No distress.  HENT: Head: Normocephalic and atraumatic.  Mouth/Throat: Oropharynx is clear and moist. No evidence of any head injury.  Oropharynx is clear.  Eyes: EOM are normal. Pupils are equal, round, and reactive to light.  Neck: Normal range of motion. Neck supple. No posterior midline cervical tenderness to palpation.  Cardiovascular: Normal rate and regular rhythm. Exam reveals no gallop and no friction rub. No murmur heard. Pulmonary/Chest: Effort  normal and breath sounds normal. No stridor. No respiratory distress. She has no wheezes. She has no rales. She exhibits no tenderness.  Abdominal: Soft. Bowel sounds are normal. There is no tenderness. There is no rebound and no guarding.  Musculoskeletal: Normal range of motion. She exhibits tenderness. She exhibits no edema.  Tenderness over the left lateral hip.  Decreased range of motion due to pain.  Left lower extremity is shortened and externally rotated.  Distal pulses are 2+.  Patient has no left knee pain or swelling.  Neurological: She is alert. She is demented.   Alert and oriented to self.  Cooperative.  Moving toes bilateral lower extremities.  Sensation to light touch intact.  Skin: Skin is warm and dry. No rash noted. She is not diaphoretic. No erythema.  Psychiatric: She has a normal mood and affect. Her behavior is normal.   Labs on Admission:  Basic Metabolic Panel: Recent Labs  Lab 03/19/17 0820  NA 131*  K 3.8  CL 96*  CO2 27  GLUCOSE 106*  BUN 11  CREATININE 0.58  CALCIUM 9.5   Liver Function Tests: Recent Labs  Lab 03/19/17 0820  AST 23  ALT 16  ALKPHOS 72  BILITOT 0.6  PROT 6.7  ALBUMIN 3.9   No results for input(s): LIPASE, AMYLASE in the last 168 hours. No results for input(s): AMMONIA in the last 168 hours. CBC: Recent Labs  Lab 03/19/17 0820  WBC 5.0  NEUTROABS 3.5  HGB 13.2  HCT 39.3  MCV 88.9  PLT 187   Cardiac Enzymes: No results for input(s): CKTOTAL, CKMB, CKMBINDEX, TROPONINI in the last 168 hours.  BNP (last 3 results) No results for input(s): PROBNP in the last 8760 hours. CBG: No results for input(s): GLUCAP in the last 168 hours.  Radiological Exams on Admission: Dg Chest 1 View  Result Date: 03/19/2017 CLINICAL DATA:  Left hip fracture. EXAM: CHEST 1 VIEW COMPARISON:  10/12/2014 FINDINGS: Mild cardiac enlargement. Aortic atherosclerosis noted. Diffuse coarsened interstitial markings are identified throughout both lungs.  No superimposed pleural effusion, pulmonary edema or airspace consolidation. IMPRESSION: 1. Chronic interstitial coarsening without superimposed acute abnormality. 2.  Aortic Atherosclerosis (ICD10-I70.0). 3. Mild cardiac enlargement. Electronically Signed   By: Kerby Moors M.D.   On: 03/19/2017 09:28   Dg Hip Unilat W Or Wo Pelvis 2-3 Views Left  Result Date: 03/19/2017 CLINICAL DATA:  Left hip pain.  Status post fall. EXAM: DG HIP (WITH OR WITHOUT PELVIS) 2-3V LEFT COMPARISON:  None. FINDINGS: Generalized osteopenia. Comminuted and mildly displaced left intertrochanteric fracture. No hip dislocation. Prior right intertrochanteric ORIF with a healed intertrochanteric fracture. Subchondral sclerosis in the right femoral head most concerning for avascular necrosis. No aggressive osseous lesion. IMPRESSION: 1. Acute comminuted and mildly displaced left intertrochanteric fracture. Electronically Signed   By: Elbert Ewings  Patel   On: 03/19/2017 09:28   EKG: Independently reviewed. NSR.   Assessment/Plan Principal Problem:   Closed intertrochanteric fracture of left hip (HCC) Active Problems:   Physical deconditioning   Hyponatremia   Hypertension   Dementia with behavioral disturbance   Malnutrition of moderate degree (HCC)   Dementia   Gait instability  1. Acute left intertrochanteric hip fracture - Keep NPO.  IV pain meds.  Gentle IVFs, Orthopedics consulted and will see the patient on arrival to [facility].  Bed Rest.  DVT prophylaxis.  A Nursing order was placed for them to please notify Dr. Mardelle Matte when patient arrives at [facility].  2. Hyponatremia - suspect from mild dehydration, continue gentle IV normal saline.  3. Hypertension - IV hydralazine ordered as needed while NPO.   4. Severe Dementia - stable, follow closely for acute delirium. She would benefit from family support at bedside and constant support and reassurance.  Also should schedule tylenol for pain management when taking p.o.  again as she is likely not going to be able to tell us when she is in pain.     5. Malnutrition of moderate degree - Dietitian consult.  6. Gait instability - fall precautions / PT eval when ok with orthopedics.   7. GAD - she is on xanax at home, will use IV lorazepam as needed while NPO.   DVT Prophylaxis: lovenox  Code Status: full   Family Communication: daughter  Disposition Plan: TBD   Time spent: 73 mins   Irwin Brakeman, MD [facility] Pager [phone]  If 7PM-7AM, please contact night-coverage [email or web address] Password [facility] 03/19/2017, 2:18 PM

## 2017-03-19 NOTE — Consult Note (Signed)
ORTHOPAEDIC CONSULTATION  REQUESTING PHYSICIAN: Sara Iba, MD  Chief Complaint: Left hip pain  HPI: Sara Bowman is a 84 y.o. female who complains of acute left hip pain after mechanical fall.  She apparently had a fall a couple of weeks ago, although was able to still ambulate.  She was squatting down today, fell over and onto her left side, acute severe pain unable to ambulate.  Seen in the emergency room and Sara Bowman, and orthopedic consultation requested, transferred down to [facility] for definitive management.  She has had a previous right-sided hip fracture as well, managed by Sara Bowman.  She has had progressive dementia that has been impairing her daily function.  Pain is better with rest, worse with movement of the left lower extremity.  History is difficult to get from the patient but her daughter is present in the room, the patient denies any other injuries.  Past Medical History:  Diagnosis Date  . Cancer ([place])    breast-left  . Confusion   . Dementia   . Hypertension    Past Surgical History:  Procedure Laterality Date  . ABDOMINAL HYSTERECTOMY    . BLADDER SURGERY     90's  problems with surgery , had procedure undone in [place] 4 days later  . BREAST LUMPECTOMY N/A 1995  . INTRAMEDULLARY (IM) NAIL INTERTROCHANTERIC Right 10/13/2014   Procedure: INTRAMEDULLARY (IM) NAIL INTERTROCHANTRIC RIGHT HIP;  Surgeon: Sara Civil, MD;  Location: [facility];  Service: Orthopedics;  Laterality: Right;   Social History   Socioeconomic History  . Marital status: Widowed    Spouse name: None  . Number of children: None  . Years of education: None  . Highest education level: None  Social Needs  . Financial resource strain: None  . Food insecurity - worry: None  . Food insecurity - inability: None  . Transportation needs - medical: None  . Transportation needs - non-medical: None  Occupational History  . None  Tobacco Use  . Smoking status: Never Smoker   . Smokeless tobacco: Never Used  Substance and Sexual Activity  . Alcohol use: No  . Drug use: No  . Sexual activity: Not Currently  Other Topics Concern  . None  Social History Narrative  . None   Family History  Problem Relation Age of Onset  . Aneurysm Mother    No Known Allergies   Positive ROS: All other systems have been reviewed and were otherwise negative with the exception of those mentioned in the HPI and as above.  Physical Exam: General: Alert, no acute distress, she is interactive, although confused, but does follow commands. Cardiovascular: No pedal edema Respiratory: No cyanosis, no use of accessory musculature GI: No organomegaly, abdomen is soft and non-tender Skin: No lesions in the area of chief complaint Neurologic: Sensation intact distally Psychiatric: Patient is not competent for consent, but her daughter is present  lymphatic: No axillary or cervical lymphadenopathy  MUSCULOSKELETAL: Left hip has positive logroll, EHL and FHL are intact, left leg is shortened and externally rotated.  Assessment: Principal Problem:   Closed intertrochanteric fracture of left hip (HCC) Active Problems:   Physical deconditioning   Hyponatremia   Hypertension   Dementia with behavioral disturbance   Malnutrition of moderate degree (HCC)   Dementia   Gait instability     Plan: This is an acute severe injury, and threatens her morbidity and mortality.  I have recommended surgical intervention in order to restore ambulatory capacity.  The risks benefits and alternatives were discussed with the patient including but not limited to the risks of nonoperative treatment, versus surgical intervention including infection, bleeding, nerve injury, malunion, nonunion, the need for revision surgery, hardware prominence, hardware failure, the need for hardware removal, blood clots, cardiopulmonary complications, morbidity, mortality, among others, and they were willing to  proceed.       Sara Bridge, MD Cell [phone]   03/19/2017 4:57 PM

## 2017-03-19 NOTE — ED Notes (Signed)
Pt to go ASAP to [facility] for surgery by Dr. Mardelle Matte.   Called to notify daughter debra.  No answer .  Unable to leave voicemail.  Mailbox full

## 2017-03-19 NOTE — Discharge Instructions (Signed)

## 2017-03-19 NOTE — Telephone Encounter (Signed)
Not on call today

## 2017-03-19 NOTE — ED Provider Notes (Signed)
[facility] EMERGENCY DEPARTMENT Provider Note   CSN: [id number] Arrival date & time: 03/19/17  3557     History   Chief Complaint Chief Complaint  Patient presents with  . Fall    HPI Sara Bowman is a 84 y.o. female.  HPI Patient with history of dementia presents with daughter by EMS.  Had a witnessed ground-level fall while walking across grass.  Landed on her left hip.  Was unable to get up off the ground.  Daughter denies any head injury or syncope.  Patient only complains of pain to the left hip.  Previous right hip fracture and repair 3 years ago. Past Medical History:  Diagnosis Date  . Cancer ([place])    breast-left  . Confusion   . Dementia   . Hypertension     Patient Active Problem List   Diagnosis Date Noted  . Closed intertrochanteric fracture of left hip ([place]) 03/19/2017  . Dementia 03/19/2017  . Gait instability 03/19/2017  . Acute blood loss anemia 10/15/2014  . Hip fracture ([place]) 10/12/2014  . Malnutrition of moderate degree ([place]) 08/19/2014  . Altered mental status 08/18/2014  . Elevated troponin 08/17/2014  . Altered mental state 08/17/2014  . Physical deconditioning 08/17/2014  . Hyponatremia 08/17/2014  . Hypertension 08/17/2014  . Dementia with behavioral disturbance 08/17/2014    Past Surgical History:  Procedure Laterality Date  . ABDOMINAL HYSTERECTOMY    . BLADDER SURGERY     90's  problems with surgery , had procedure undone in [place] 4 days later  . BREAST LUMPECTOMY N/A 1995  . INTRAMEDULLARY (IM) NAIL INTERTROCHANTERIC Right 10/13/2014   Procedure: INTRAMEDULLARY (IM) NAIL INTERTROCHANTRIC RIGHT HIP;  Surgeon: Carole Civil, MD;  Location: [facility];  Service: Orthopedics;  Laterality: Right;    OB History    No data available       Home Medications    Prior to Admission medications   Medication Sig Start Date End Date Taking? Authorizing Provider  acidophilus (RISAQUAD) CAPS capsule Take 2 capsules by mouth  daily. Patient taking differently: Take 1 capsule by mouth daily.  10/15/14  Yes Sinda Du, MD  ALPRAZolam Duanne Moron) 0.25 MG tablet Take 1 tablet by mouth 2 (two) times daily as needed for anxiety.  07/08/14  Yes [provider]  amLODipine (NORVASC) 5 MG tablet Take 1 tablet by mouth daily. 07/17/14  Yes [provider]  aspirin EC 81 MG EC tablet Take 1 tablet (81 mg total) by mouth daily. 08/19/14  Yes Sinda Du, MD  feeding supplement, ENSURE ENLIVE, (ENSURE ENLIVE) LIQD Take 237 mLs by mouth 2 (two) times daily between meals. 08/19/14  Yes Sinda Du, MD  lisinopril (PRINIVIL,ZESTRIL) 20 MG tablet Take 1 tablet by mouth daily. 07/17/14  Yes [provider]    Family History Family History  Problem Relation Age of Onset  . Aneurysm Mother     Social History Social History   Tobacco Use  . Smoking status: Never Smoker  . Smokeless tobacco: Never Used  Substance Use Topics  . Alcohol use: No  . Drug use: No     Allergies   Patient has no known allergies.   Review of Systems Review of Systems  Unable to perform ROS: Dementia     Physical Exam Updated Vital Signs BP (!) 154/76   Pulse 77   Temp 97.9 F (36.6 C) (Oral)   Resp 16   SpO2 99%   Physical Exam  Constitutional: She appears well-developed  and well-nourished. No distress.  HENT:  Head: Normocephalic and atraumatic.  Mouth/Throat: Oropharynx is clear and moist.  No evidence of any head injury.  Oropharynx is clear.  Eyes: EOM are normal. Pupils are equal, round, and reactive to light.  Neck: Normal range of motion. Neck supple.  No posterior midline cervical tenderness to palpation.  Cardiovascular: Normal rate and regular rhythm. Exam reveals no gallop and no friction rub.  No murmur heard. Pulmonary/Chest: Effort normal and breath sounds normal. No stridor. No respiratory distress. She has no wheezes. She has no rales. She exhibits no tenderness.  Abdominal: Soft.  Bowel sounds are normal. There is no tenderness. There is no rebound and no guarding.  Musculoskeletal: Normal range of motion. She exhibits tenderness. She exhibits no edema.  Tenderness over the left lateral hip.  Decreased range of motion due to pain.  Left lower extremity is shortened and externally rotated.  Distal pulses are 2+.  Patient has no left knee pain or swelling.  Neurological: She is alert.  Alert and oriented to self.  Cooperative.  Moving toes bilateral lower extremities.  Sensation to light touch intact.  Skin: Skin is warm and dry. No rash noted. She is not diaphoretic. No erythema.  Psychiatric: She has a normal mood and affect. Her behavior is normal.  Nursing note and vitals reviewed.    ED Treatments / Results  Labs (all labs ordered are listed, but only abnormal results are displayed) Labs Reviewed  COMPREHENSIVE METABOLIC PANEL - Abnormal; Notable for the following components:      Result Value   Sodium 131 (*)    Chloride 96 (*)    Glucose, Bld 106 (*)    All other components within normal limits  CBC WITH DIFFERENTIAL/PLATELET  URINALYSIS, ROUTINE W REFLEX MICROSCOPIC    EKG  EKG Interpretation  Date/Time:  Tuesday March 19 2017 08:56:12 EST Ventricular Rate:  77 PR Interval:    QRS Duration: 79 QT Interval:  377 QTC Calculation: 427 R Axis:   30 Text Interpretation:  Sinus rhythm Confirmed by Julianne Rice [phone]) on 03/19/2017 2:33:29 PM       Radiology Dg Chest 1 View  Result Date: 03/19/2017 CLINICAL DATA:  Left hip fracture. EXAM: CHEST 1 VIEW COMPARISON:  10/12/2014 FINDINGS: Mild cardiac enlargement. Aortic atherosclerosis noted. Diffuse coarsened interstitial markings are identified throughout both lungs. No superimposed pleural effusion, pulmonary edema or airspace consolidation. IMPRESSION: 1. Chronic interstitial coarsening without superimposed acute abnormality. 2.  Aortic Atherosclerosis (ICD10-I70.0). 3. Mild cardiac  enlargement. Electronically Signed   By: Kerby Moors M.D.   On: 03/19/2017 09:28   Dg Hip Unilat W Or Wo Pelvis 2-3 Views Left  Result Date: 03/19/2017 CLINICAL DATA:  Left hip pain.  Status post fall. EXAM: DG HIP (WITH OR WITHOUT PELVIS) 2-3V LEFT COMPARISON:  None. FINDINGS: Generalized osteopenia. Comminuted and mildly displaced left intertrochanteric fracture. No hip dislocation. Prior right intertrochanteric ORIF with a healed intertrochanteric fracture. Subchondral sclerosis in the right femoral head most concerning for avascular necrosis. No aggressive osseous lesion. IMPRESSION: 1. Acute comminuted and mildly displaced left intertrochanteric fracture. Electronically Signed   By: Kathreen Devoid   On: 03/19/2017 09:28    Procedures Procedures (including critical care time)  Medications Ordered in ED Medications  0.9 %  sodium chloride infusion ( Intravenous New Bag/Given 03/19/17 0857)  fentaNYL (SUBLIMAZE) injection 25 mcg (25 mcg Intravenous Given 03/19/17 1238)  ondansetron (ZOFRAN) injection 4 mg (4 mg Intravenous Given 03/19/17 1238)  Initial Impression / Assessment and Plan / ED Course  I have reviewed the triage vital signs and the nursing notes.  Pertinent labs & imaging results that were available during my care of the patient were reviewed by me and considered in my medical decision making (see chart for details).    Patient with left intertrochanteric hip fracture.  Dr. Aline Brochure was paged.  Delay in returning phone call due to him being in surgery.  He has repaired her right hip in the past. Discussed with Dr. Mardelle Matte.  Will accept the patient.  Hospitalist to admit and transfer to [facility].  Final Clinical Impressions(s) / ED Diagnoses   Final diagnoses:  Closed fracture of left hip, initial encounter [facility])    ED Discharge Orders    None       Julianne Rice, MD 03/19/17 1433

## 2017-03-19 NOTE — Telephone Encounter (Signed)
Relayed to [place], Emergency room.  States she will relay to Dr Lita Mains.

## 2017-03-19 NOTE — Anesthesia Procedure Notes (Signed)
Date/Time: 03/19/2017 5:31 PM Performed by: Eligha Bridegroom, CRNA Pre-anesthesia Checklist: Patient identified, Emergency Drugs available, Suction available, Patient being monitored and Timeout performed Patient Re-evaluated:Patient Re-evaluated prior to induction Oxygen Delivery Method: Nasal cannula Preoxygenation: Pre-oxygenation with 100% oxygen

## 2017-03-20 ENCOUNTER — Encounter (HOSPITAL_COMMUNITY): Payer: Self-pay | Admitting: Orthopedic Surgery

## 2017-03-20 DIAGNOSIS — G309 Alzheimer's disease, unspecified: Secondary | ICD-10-CM

## 2017-03-20 DIAGNOSIS — S72142A Displaced intertrochanteric fracture of left femur, initial encounter for closed fracture: Principal | ICD-10-CM

## 2017-03-20 DIAGNOSIS — F0281 Dementia in other diseases classified elsewhere with behavioral disturbance: Secondary | ICD-10-CM

## 2017-03-20 DIAGNOSIS — S72002A Fracture of unspecified part of neck of left femur, initial encounter for closed fracture: Secondary | ICD-10-CM

## 2017-03-20 LAB — CBC WITH DIFFERENTIAL/PLATELET
BASOS PCT: 0 %
Basophils Absolute: 0 10*3/uL (ref 0.0–0.1)
EOS ABS: 0 10*3/uL (ref 0.0–0.7)
Eosinophils Relative: 0 %
HCT: 24.8 % — ABNORMAL LOW (ref 36.0–46.0)
HEMOGLOBIN: 8.4 g/dL — AB (ref 12.0–15.0)
Lymphocytes Relative: 17 %
Lymphs Abs: 1.2 10*3/uL (ref 0.7–4.0)
MCH: 30.2 pg (ref 26.0–34.0)
MCHC: 33.9 g/dL (ref 30.0–36.0)
MCV: 89.2 fL (ref 78.0–100.0)
MONO ABS: 0.4 10*3/uL (ref 0.1–1.0)
Monocytes Relative: 5 %
NEUTROS PCT: 78 %
Neutro Abs: 5.5 10*3/uL (ref 1.7–7.7)
PLATELETS: 139 10*3/uL — AB (ref 150–400)
RBC: 2.78 MIL/uL — ABNORMAL LOW (ref 3.87–5.11)
RDW: 12.8 % (ref 11.5–15.5)
WBC: 7.1 10*3/uL (ref 4.0–10.5)

## 2017-03-20 LAB — URINALYSIS, ROUTINE W REFLEX MICROSCOPIC
Bilirubin Urine: NEGATIVE
GLUCOSE, UA: NEGATIVE mg/dL
HGB URINE DIPSTICK: NEGATIVE
Ketones, ur: NEGATIVE mg/dL
LEUKOCYTES UA: NEGATIVE
Nitrite: NEGATIVE
PH: 6 (ref 5.0–8.0)
PROTEIN: NEGATIVE mg/dL
SPECIFIC GRAVITY, URINE: 1.017 (ref 1.005–1.030)

## 2017-03-20 LAB — COMPREHENSIVE METABOLIC PANEL
ALK PHOS: 47 U/L (ref 38–126)
ALT: 13 U/L — AB (ref 14–54)
AST: 21 U/L (ref 15–41)
Albumin: 3 g/dL — ABNORMAL LOW (ref 3.5–5.0)
Anion gap: 8 (ref 5–15)
BUN: 8 mg/dL (ref 6–20)
CALCIUM: 8.5 mg/dL — AB (ref 8.9–10.3)
CO2: 25 mmol/L (ref 22–32)
CREATININE: 0.64 mg/dL (ref 0.44–1.00)
Chloride: 97 mmol/L — ABNORMAL LOW (ref 101–111)
GFR calc non Af Amer: >60 mL/min (ref >60)
Glucose, Bld: 148 mg/dL — ABNORMAL HIGH (ref 65–99)
Potassium: 3.7 mmol/L (ref 3.5–5.1)
SODIUM: 130 mmol/L — AB (ref 135–145)
Total Bilirubin: 0.6 mg/dL (ref 0.3–1.2)
Total Protein: 4.9 g/dL — ABNORMAL LOW (ref 6.5–8.1)

## 2017-03-20 MED ORDER — LORAZEPAM 0.5 MG PO TABS: 0.5000 mg | ORAL_TABLET | Freq: Four times a day (QID) | ORAL | Status: DC | PRN

## 2017-03-20 MED ORDER — HYDROCODONE-ACETAMINOPHEN 5-325 MG PO TABS
1.0000 | ORAL_TABLET | Freq: Four times a day (QID) | ORAL | Status: DC | PRN
  Administered 2017-03-21: 1 via ORAL
  Filled 2017-03-20: qty 1

## 2017-03-20 NOTE — Progress Notes (Addendum)
Patient ID: Sara Bowman, female   DOB: 10-25-1923, 84 y.o.   MRN: [id number]     Subjective:  Patient reports pain as mild to moderate.  Patient confused and not aware of time or place.  I did have to wake the patient for the exam  Objective:   VITALS:   Vitals:   03/19/17 2227 03/19/17 2300 03/20/17 0351 03/20/17 0918  BP: (!) 142/68 140/63 127/64 (!) 100/44  Pulse: 87 90 93 99  Resp:  16 16   Temp:  97.9 F (36.6 C) 98.8 F (37.1 C) 97.6 F (36.4 C)  TempSrc:  Axillary Axillary Oral  SpO2:  100% 99% 98%  Weight:      Height:        ABD soft Sensation intact distally Dorsiflexion/Plantar flexion intact Incision: dressing C/D/I and scant drainage   Lab Results  Component Value Date   WBC 7.1 03/20/2017   HGB 8.4 (L) 03/20/2017   HCT 24.8 (L) 03/20/2017   MCV 89.2 03/20/2017   PLT 139 (L) 03/20/2017   BMET    Component Value Date/Time   NA 130 (L) 03/20/2017 0655   K 3.7 03/20/2017 0655   CL 97 (L) 03/20/2017 0655   CO2 25 03/20/2017 0655   GLUCOSE 148 (H) 03/20/2017 0655   BUN 8 03/20/2017 0655   CREATININE 0.64 03/20/2017 0655   CALCIUM 8.5 (L) 03/20/2017 0655   GFRNONAA >60 03/20/2017 0655   GFRAA >60 03/20/2017 0655     Assessment/Plan: 1 Day Post-Op   Principal Problem:   Closed intertrochanteric fracture of left hip (HCC) Active Problems:   Physical deconditioning   Hyponatremia   Hypertension   Dementia with behavioral disturbance   Malnutrition of moderate degree (HCC)   Dementia   Gait instability   Advance diet Up with therapy Continue plan per medicine WBAT Dry dressing PRN Continue to monitor her meds and mental status    Remonia Richter 03/20/2017, 9:42 AM  Discussed and agree with above. ABLA, observe for now.  Ok to dc all pain meds if confusion worsening.  Will follow.  Marchia Bond, MD Cell [phone]

## 2017-03-20 NOTE — Evaluation (Signed)
Physical Therapy Evaluation Patient Details Name: LYNEE ROSENBACH MRN: [id number] DOB: [birth year] Today's Date: 03/20/2017   History of Present Illness  Sara Bowman is a 84 y.o. female who complains of acute left hip pain after mechanical fall. Intertrochanteric fx, now s/p IMNail, WBAT;  has a past medical history of Cancer ([place]), Confusion, Dementia, and Hypertension. R hip fx in 9/18, IMNail  Clinical Impression  Patient is s/p above surgery resulting in functional limitations due to the deficits listed below (see PT Problem List). Requiring +2 max/total assist for all mobility; I know she had a right hip fracture 9/18 - I wonder if her recovery from this injury will mirror her recovery from previous hip fx; Recommending SNF for post-acute rehab;  Patient will benefit from skilled PT to increase their independence and safety with mobility to allow discharge to the venue listed below.       Follow Up Recommendations SNF;Supervision/Assistance - 24 hour    Equipment Recommendations  Rolling walker with 5" wheels;3in1 (PT)    Recommendations for Other Services       Precautions / Restrictions Precautions Precautions: Fall Restrictions LLE Weight Bearing: Weight bearing as tolerated      Mobility  Bed Mobility Overal bed mobility: Needs Assistance Bed Mobility: Supine to Sit     Supine to sit: Max assist;+2 for physical assistance     General bed mobility comments: Max assist and use of bed pad; second person necessary to support trunk coming to sit  Transfers Overall transfer level: Needs assistance Equipment used: 2 person hand held assist Transfers: Squat Pivot Transfers     Squat pivot transfers: +2 physical assistance;Total assist     General transfer comment: Total assist of 2 for basic pivot OOB to chair placed on pt's non-operative R side to mimimize pain; R knee blocked for stability  Ambulation/Gait                Stairs             Wheelchair Mobility    Modified Rankin (Stroke Patients Only)       Balance                                             Pertinent Vitals/Pain Pain Assessment: Faces Faces Pain Scale: Hurts whole lot Pain Location: L hip with bed mobitliy and transfer Pain Descriptors / Indicators: Grimacing;Guarding Pain Intervention(s): Monitored during session;Repositioned    Home Living Family/patient expects to be discharged to:: Skilled nursing facility                      Prior Function Level of Independence: Needs assistance   Gait / Transfers Assistance Needed: Furniture walks and holds daughter's arm to walk  ADL's / Homemaking Assistance Needed: Family assist with bathing        Hand Dominance        Extremity/Trunk Assessment   Upper Extremity Assessment Upper Extremity Assessment: Generalized weakness    Lower Extremity Assessment Lower Extremity Assessment: LLE deficits/detail LLE Deficits / Details: Grossly decr AROM and strength, limited by pain postop; muscle guarding noted with attempts at L hip AAROM and PROM in all planes LLE: Unable to fully assess due to pain       Communication   Communication: HOH  Cognition Arousal/Alertness: Awake/alert;Suspect due to medications(but sleepy) Behavior During  Therapy: Flat affect;WFL for tasks assessed/performed Overall Cognitive Status: History of cognitive impairments - at baseline                                 General Comments: Advanced dementia      General Comments      Exercises     Assessment/Plan    PT Assessment Patient needs continued PT services  PT Problem List Decreased strength;Decreased range of motion;Decreased activity tolerance;Decreased balance;Decreased mobility;Decreased coordination;Decreased cognition;Decreased knowledge of use of DME;Decreased safety awareness;Decreased knowledge of precautions;Pain       PT Treatment Interventions DME  instruction;Gait training;Functional mobility training;Therapeutic activities;Therapeutic exercise;Balance training;Cognitive remediation;Patient/family education    PT Goals (Current goals can be found in the Care Plan section)  Acute Rehab PT Goals Patient Stated Goal: did not speak during session PT Goal Formulation: Patient unable to participate in goal setting Time For Goal Achievement: 04/03/17 Potential to Achieve Goals: Good    Frequency Min 2X/week   Barriers to discharge        Co-evaluation               AM-PAC PT "6 Clicks" Daily Activity  Outcome Measure Difficulty turning over in bed (including adjusting bedclothes, sheets and blankets)?: Unable Difficulty moving from lying on back to sitting on the side of the bed? : Unable Difficulty sitting down on and standing up from a chair with arms (e.g., wheelchair, bedside commode, etc,.)?: Unable Help needed moving to and from a bed to chair (including a wheelchair)?: Total Help needed walking in hospital room?: Total Help needed climbing 3-5 steps with a railing? : Total 6 Click Score: 6    End of Session Equipment Utilized During Treatment: Gait belt Activity Tolerance: Patient tolerated treatment well(though painful) Patient left: in chair;with call bell/phone within reach;with chair alarm set Nurse Communication: Mobility status;Other (comment)(described transfer) PT Visit Diagnosis: Other abnormalities of gait and mobility (R26.89);Pain Pain - Right/Left: Left Pain - part of body: Hip    Time: 1441-1500 PT Time Calculation (min) (ACUTE ONLY): 19 min   Charges:   PT Evaluation $PT Eval Moderate Complexity: 1 Mod     PT G Codes:        Roney Marion, PT  Acute Rehabilitation Services Pager [phone] Office [phone]   Colletta [place] 03/20/2017, 4:29 PM

## 2017-03-20 NOTE — Progress Notes (Signed)
Initial Nutrition Assessment  DOCUMENTATION CODES:   Not applicable  INTERVENTION:    Ensure Enlive po BID, each supplement provides 350 kcal and 20 grams of protein  NUTRITION DIAGNOSIS:   Increased nutrient needs related to hip fracture as evidenced by estimated needs  GOAL:   Patient will meet greater than or equal to 90% of their needs  MONITOR:   PO intake, Supplement acceptance, Labs, Skin, Weight trends  REASON FOR ASSESSMENT:   Consult Assessment of nutrition requirement/status  ASSESSMENT:   84 y.o. demented Female presented to ED by EMS with daughter after a witnessed fall at home.   RD spoke with pt's daughter at bedside.  Pt s/p bedside swallow evaluation. SLP rec Regular, thin liquids. Daughter reports pt was eating well PTA. Likes Ensure supplements.  Medications include colace, miralax and ferrous sulfate. Labs reviewed. Na 130 (L). Glucose 148 (H). Weight has been stable.  NUTRITION - FOCUSED PHYSICAL EXAM:  Unable to complete at this time. Pt in significant pain.  Diet Order:  Diet regular Room service appropriate? Yes; Fluid consistency: Thin  EDUCATION NEEDS:   Not appropriate for education at this time  Skin:  Skin Assessment: Reviewed RN Assessment  Last BM:  N/A  Height:   Ht Readings from Last 1 Encounters:  03/19/17 5\' 2"  (1.575 m)   Weight:   Wt Readings from Last 1 Encounters:  03/19/17 112 lb (50.8 kg)   Ideal Body Weight:  50 kg  BMI:  Body mass index is 20.49 kg/m.  Estimated Nutritional Needs:   Kcal:  1100-1300  Protein:  55-70 gm  Fluid:  >/= 1.5 L  Arthur Holms, RD, LDN Pager #: [phone] After-Hours Pager #: [phone]

## 2017-03-20 NOTE — Progress Notes (Signed)
Physical Therapy Note   Orders received and chart reviewed;  Will plan to evaluate Ms. Sharber for mobility today;   Noted in same active order set:  Bedrest;  Non-weight bearing;  Weight bearing as tolerated;   Will need clarification of weight bearing and activity before proceeding with PT eval; Noted in Op Note that she will be weight bearing as tolerated, so will proceed weight bearing as tolerated, but would like order set to be cleaned up and clarified; Will page Dr. Tana Coast re: activity and discuss weight bearing orders with RN;   Thank you,  Roney Marion, [place] Pager [phone] Office [phone]

## 2017-03-20 NOTE — Progress Notes (Signed)
[facility]                                                                              Patient Demographics  Sara Bowman, is a 84 y.o. female, DOB - 1923-11-13, NLG:[id number]  Admit date - 03/19/2017   Admitting Physician [name], MD  Outpatient Primary MD for the patient is Sinda Du, MD  Outpatient specialists:   LOS - 1  days   Medical records reviewed and are as summarized below:    Chief Complaint  Patient presents with  . Fall       Brief summary      Assessment & Plan    Principal Problem:   Closed intertrochanteric fracture of left hip (HCC) -Presented with witnessed fall at home, left hip intertrochanteric fracture. -Orthopedics consulted, patient underwent OR on 2/26 with intramedullary nail, postop day #1 -Pain control, DVT prophylaxis per orthopedics   Active Problems:   Physical deconditioning -PT OT evaluation, will likely need skilled nursing facility    Hyponatremia - likely from mild dehydration     Hypertension - BP soft, hold lisinopril, norvasc       Dementia with behavioral disturbance - minimize narcotics, better sleep hygiene, PT OT    Malnutrition of moderate degree ([place]) -Nutrition consult  Code Status: Full code DVT Prophylaxis:  Lovenox  Family Communication: No family member at the bedside   Disposition Plan: Will need skilled nursing facility Time Spent in minutes   35 minutes  Procedures:  Left intramedullary nail on 2/26  Consultants:   Orthopedics  Antimicrobials:   None   Medications  Scheduled Meds: . acidophilus  1 capsule Oral Daily  . amLODipine  5 mg Oral Daily  . docusate sodium  100 mg Oral BID  . enoxaparin (LOVENOX) injection  40 mg Subcutaneous Q24H  . feeding supplement (ENSURE ENLIVE)  237 mL Oral BID BM  . ferrous sulfate  325 mg Oral TID PC  . lisinopril  20 mg Oral Daily  . senna  1 tablet Oral BID   Continuous Infusions: . sodium chloride      . sodium chloride 75 mL/hr (03/20/17 1058)   PRN Meds:.acetaminophen **OR** acetaminophen, alum & mag hydroxide-simeth, bisacodyl, hydrALAZINE, HYDROcodone-acetaminophen, LORazepam, magnesium citrate, menthol-cetylpyridinium **OR** phenol, morphine injection, ondansetron **OR** ondansetron (ZOFRAN) IV, polyethylene glycol, senna-docusate   Antibiotics   Anti-infectives (From admission, onward)   Start     Dose/Rate Route Frequency Ordered Stop   03/20/17 0600  ceFAZolin (ANCEF) IVPB 2g/100 mL premix     2 g 200 mL/hr over 30 Minutes Intravenous On call to O.R. 03/19/17 1634 03/19/17 1730   03/19/17 2330  ceFAZolin (ANCEF) IVPB 2g/100 mL premix     2 g 200 mL/hr over 30 Minutes Intravenous Every 6 hours 03/19/17 2119 03/20/17 0533   03/19/17 1635  ceFAZolin (ANCEF) 2-4 GM/100ML-% IVPB    Comments:  Scronce, Trina   : cabinet override      03/19/17 1635 03/19/17 1730        Subjective:   Sara Bowman was seen and examined today.  Somewhat somnolent, arousable otherwise no complaints.  Pain  controlled with medications. Patient denies dizziness, chest pain, shortness of breath, abdominal pain. No acute events overnight.    Objective:   Vitals:   03/20/17 0351 03/20/17 0918 03/20/17 1129 03/20/17 1246  BP: 127/64 (!) 100/44 (!) 132/49 (!) 105/47  Pulse: 93 99  (!) 103  Resp: 16     Temp: 98.8 F (37.1 C) 97.6 F (36.4 C)  98.5 F (36.9 C)  TempSrc: Axillary Oral  Oral  SpO2: 99% 98%  98%  Weight:      Height:        Intake/Output Summary (Last 24 hours) at 03/20/2017 1358 Last data filed at 03/20/2017 1230 Gross per 24 hour  Intake 1890 ml  Output 2450 ml  Net -560 ml     Wt Readings from Last 3 Encounters:  03/19/17 50.8 kg (112 lb)  01/03/17 45.4 kg (100 lb)  08/31/16 49.9 kg (110 lb)     Exam  General: Somewhat somnolent, arousable  Eyes:   HEENT:  Atraumatic, normocephalic, normal oropharynx  Cardiovascular: S1 S2 clear, RRR  Respiratory: Clear  to auscultation bilaterally, no wheezing, rales or rhonchi  Gastrointestinal: Soft, nontender, nondistended, + bowel sounds  Ext: no pedal edema bilaterally  Neuro: no new deficits however difficult to assess, somewhat somnolent  Musculoskeletal: No digital cyanosis, clubbing  Skin: No rashes  Psych: sleepy but arousable   Data Reviewed:  I have personally reviewed following labs and imaging studies  Micro Results No results found for this or any previous visit (from the past 240 hour(s)).  Radiology Reports Dg Chest 1 View  Result Date: 03/19/2017 CLINICAL DATA:  Left hip fracture. EXAM: CHEST 1 VIEW COMPARISON:  10/12/2014 FINDINGS: Mild cardiac enlargement. Aortic atherosclerosis noted. Diffuse coarsened interstitial markings are identified throughout both lungs. No superimposed pleural effusion, pulmonary edema or airspace consolidation. IMPRESSION: 1. Chronic interstitial coarsening without superimposed acute abnormality. 2.  Aortic Atherosclerosis (ICD10-I70.0). 3. Mild cardiac enlargement. Electronically Signed   By: Kerby Moors M.D.   On: 03/19/2017 09:28   Pelvis Portable  Result Date: 03/19/2017 CLINICAL DATA:  Status post ORIF of fracture EXAM: PORTABLE PELVIS 1-2 VIEWS COMPARISON:  03/19/2017 FINDINGS: Prior intramedullary rod and screw fixation of right femur. Pubic symphysis and rami are intact. Interval intramedullary rod fixation of the left femur for intertrochanteric fracture. Small amount of soft tissue gas lateral hip and proximal thigh. Vascular calcifications. IMPRESSION: Intramedullary rod fixation of comminuted left intertrochanteric fracture with expected postsurgical changes Electronically Signed   By: Donavan Foil M.D.   On: 03/19/2017 20:23   Dg Hip Unilat W Or Wo Pelvis 2-3 Views Left  Result Date: 03/19/2017 CLINICAL DATA:  Left hip pain.  Status post fall. EXAM: DG HIP (WITH OR WITHOUT PELVIS) 2-3V LEFT COMPARISON:  None. FINDINGS: Generalized  osteopenia. Comminuted and mildly displaced left intertrochanteric fracture. No hip dislocation. Prior right intertrochanteric ORIF with a healed intertrochanteric fracture. Subchondral sclerosis in the right femoral head most concerning for avascular necrosis. No aggressive osseous lesion. IMPRESSION: 1. Acute comminuted and mildly displaced left intertrochanteric fracture. Electronically Signed   By: Kathreen Devoid   On: 03/19/2017 09:28   Dg Femur Min 2 Views Left  Result Date: 03/19/2017 CLINICAL DATA:  Internal fixation EXAM: LEFT FEMUR 2 VIEWS COMPARISON:  03/19/2017 FINDINGS: Placement of intramedullary nail and dynamic hip screw across the left femoral intertrochanteric fracture. Anatomic alignment. No hardware complicating feature. IMPRESSION: Internal fixation.  Anatomic alignment without complicating feature. Electronically Signed   By: Rolm Baptise  M.D.   On: 03/19/2017 20:20    Lab Data:  CBC: Recent Labs  Lab 03/19/17 0820 03/19/17 2148 03/20/17 0655  WBC 5.0 9.3 7.1  NEUTROABS 3.5  --  5.5  HGB 13.2 9.7* 8.4*  HCT 39.3 27.7* 24.8*  MCV 88.9 87.9 89.2  PLT 187 146* 092*   Basic Metabolic Panel: Recent Labs  Lab 03/19/17 0820 03/19/17 2148 03/20/17 0655  NA 131*  --  130*  K 3.8  --  3.7  CL 96*  --  97*  CO2 27  --  25  GLUCOSE 106*  --  148*  BUN 11  --  8  CREATININE 0.58 0.53 0.64  CALCIUM 9.5  --  8.5*   GFR: Estimated Creatinine Clearance: 36.2 mL/min (by C-G formula based on SCr of 0.64 mg/dL). Liver Function Tests: Recent Labs  Lab 03/19/17 0820 03/20/17 0655  AST 23 21  ALT 16 13*  ALKPHOS 72 47  BILITOT 0.6 0.6  PROT 6.7 4.9*  ALBUMIN 3.9 3.0*   No results for input(s): LIPASE, AMYLASE in the last 168 hours. No results for input(s): AMMONIA in the last 168 hours. Coagulation Profile: No results for input(s): INR, PROTIME in the last 168 hours. Cardiac Enzymes: No results for input(s): CKTOTAL, CKMB, CKMBINDEX, TROPONINI in the last 168  hours. BNP (last 3 results) No results for input(s): PROBNP in the last 8760 hours. HbA1C: No results for input(s): HGBA1C in the last 72 hours. CBG: No results for input(s): GLUCAP in the last 168 hours. Lipid Profile: No results for input(s): CHOL, HDL, LDLCALC, TRIG, CHOLHDL, LDLDIRECT in the last 72 hours. Thyroid Function Tests: No results for input(s): TSH, T4TOTAL, FREET4, T3FREE, THYROIDAB in the last 72 hours. Anemia Panel: No results for input(s): VITAMINB12, FOLATE, FERRITIN, TIBC, IRON, RETICCTPCT in the last 72 hours. Urine analysis:    Component Value Date/Time   COLORURINE YELLOW 03/20/2017 1231   APPEARANCEUR CLEAR 03/20/2017 1231   LABSPEC 1.017 03/20/2017 1231   PHURINE 6.0 03/20/2017 1231   GLUCOSEU NEGATIVE 03/20/2017 1231   HGBUR NEGATIVE 03/20/2017 1231   BILIRUBINUR NEGATIVE 03/20/2017 1231   KETONESUR NEGATIVE 03/20/2017 1231   PROTEINUR NEGATIVE 03/20/2017 1231   UROBILINOGEN 0.2 10/12/2014 2217   NITRITE NEGATIVE 03/20/2017 1231   LEUKOCYTESUR NEGATIVE 03/20/2017 [place] M.D. [facility] 03/20/2017, 1:58 PM  Pager: [phone] Between 7am to 7pm - call Pager - [phone]  After 7pm go to [email or web address] - password [email or web address]  Call night coverage person covering after 7pm

## 2017-03-20 NOTE — Evaluation (Signed)
Clinical/Bedside Swallow Evaluation Patient Details  Name: Sara Bowman MRN: [id number] Date of Birth: 01/03/1924  Today's Date: 03/20/2017 Time: SLP Start Time (ACUTE ONLY): 0933 SLP Stop Time (ACUTE ONLY): 0946 SLP Time Calculation (min) (ACUTE ONLY): 13 min  Past Medical History:  Past Medical History:  Diagnosis Date  . Cancer ([place])    breast-left  . Confusion   . Dementia   . Hypertension    Past Surgical History:  Past Surgical History:  Procedure Laterality Date  . ABDOMINAL HYSTERECTOMY    . BLADDER SURGERY     90's  problems with surgery , had procedure undone in [place] 4 days later  . BREAST LUMPECTOMY N/A 1995  . INTRAMEDULLARY (IM) NAIL INTERTROCHANTERIC Right 10/13/2014   Procedure: INTRAMEDULLARY (IM) NAIL INTERTROCHANTRIC RIGHT HIP;  Surgeon: Carole Civil, MD;  Location: [facility];  Service: Orthopedics;  Laterality: Right;   HPI:  84 year old female admitted s/p witnessed fall at home, hip fracture s/p repair on 2/26. PMH of severe dementia.    Assessment / Plan / Recommendation Clinical Impression  Patient presents with normal oropharyngeal swallowing function. No overt indication of aspiration observed. No SLP f/u indicated.  SLP Visit Diagnosis: Dysphagia, unspecified (R13.10)    Aspiration Risk  Mild aspiration risk    Diet Recommendation Regular;Thin liquid   Liquid Administration via: Cup;Straw Medication Administration: Whole meds with liquid Supervision: Patient able to self feed;Intermittent supervision to cue for compensatory strategies Compensations: Slow rate;Small sips/bites Postural Changes: Seated upright at 90 degrees    Other  Recommendations Oral Care Recommendations: Oral care BID   Follow up Recommendations None        Swallow Study   General HPI: 84 year old female admitted s/p witnessed fall at home, hip fracture s/p repair on 2/26. PMH of severe dementia.  Type of Study: Bedside Swallow Evaluation Previous Swallow  Assessment: none Diet Prior to this Study: Regular;Thin liquids Temperature Spikes Noted: No Respiratory Status: Room air History of Recent Intubation: Yes(surgery only) Date extubated: 03/19/17 Behavior/Cognition: Alert;Cooperative;Pleasant mood;Requires cueing(HOH) Oral Cavity Assessment: Within Functional Limits Oral Care Completed by SLP: No Oral Cavity - Dentition: Adequate natural dentition Vision: Functional for self-feeding Self-Feeding Abilities: Able to feed self Patient Positioning: Upright in bed Baseline Vocal Quality: Normal Volitional Cough: Strong Volitional Swallow: Able to elicit    Oral/Motor/Sensory Function Overall Oral Motor/Sensory Function: Within functional limits   Ice Chips Ice chips: Not tested   Thin Liquid Thin Liquid: Within functional limits Presentation: Straw    Nectar Thick Nectar Thick Liquid: Not tested   Honey Thick Honey Thick Liquid: Not tested   Puree Puree: Not tested   Solid   Sara Sarnowski MA, CCC-SLP [phone]    Solid: Within functional limits        Sara Bowman Meryl 03/20/2017,9:48 AM

## 2017-03-20 NOTE — Progress Notes (Signed)
Orthopedic Tech Progress Note Patient Details:  Sara Bowman 27-May-1923 [id number]  Patient ID: Rollene Fare, female   DOB: 11-Aug-1923, 84 y.o.   MRN: [id number] Pt cant have ohf due to age restrictions  Karolee Stamps 03/20/2017, 7:00 PM

## 2017-03-20 NOTE — Clinical Social Work Note (Signed)
Clinical Social Work Assessment  Patient Details  Name: Sara Bowman MRN: [id number] Date of Birth: 1924/01/14  Date of referral:  03/20/17               Reason for consult:  Facility Placement, Discharge Planning                Permission sought to share information with:  Family Supports Permission granted to share information::  Yes, Verbal Permission Granted  Name::     Marcelline Deist  Agency::  [facility] [employer]    Relationship::  daughter  Contact Information:  [phone]  Housing/Transportation Living arrangements for the past 2 months:  Single Family Home Source of Information:  Adult Children Patient Interpreter Needed:  None Criminal Activity/Legal Involvement Pertinent to Current Situation/Hospitalization:  No - Comment as needed Significant Relationships:  Adult Children Lives with:  Self(pt's daughter stays at home with pt at night and pt has care giver during the day) Do you feel safe going back to the place where you live?  Yes Need for family participation in patient care:  Yes (Comment)  Care giving concerns:  Patient daughter Hilda Blades at bedside. Hilda Blades stated that patient lives in the house next to her. Hilda Blades stated patient has a care taker during the day while she's at work and when the care taker leaves Hilda Blades takes care of patient and stays in the home with her at night   Social Worker assessment / plan:  CSW met patient at bedside to discuss disposition plan. Daughter debra stated she is agreeable to discharge patient to a facility. Hilda Blades stated she would prefer [facility]. Hilda Blades stated she does not want [facility] because patient was at facility several years back and they did not have a good experience with the facility. CSW reached out to admissions coordinator at [facility] and stated that family would like there facility. Family aware that since patient has medicare patient will need to have a 3 night stay.   Employment status:  Retired Forensic scientist:   [employer] PT Recommendations:  [place] / Referral to community resources:  [place]  Patient/Family's Response to care:  Patients daughter is very knowledgeable of patients needs and is supportive of her   Patient/Family's Understanding of and Emotional Response to Diagnosis, Current Treatment, and Prognosis:  Family agreeable for patient to discharge to facility once medically cleared    Emotional Assessment Appearance:  Appears stated age Attitude/Demeanor/Rapport:  Other Affect (typically observed):  Calm(pt woke up to smile at [place] and went back to sleep) Orientation:  Oriented to Self Alcohol / Substance use:  Not Applicable Psych involvement (Current and /or in the community):  No (Comment)  Discharge Needs  Concerns to be addressed:  Other (Comment Required Readmission within the last 30 days:  No Current discharge risk:  None Barriers to Discharge:  No Barriers Identified   Wende Neighbors, LCSW 03/20/2017, 2:40 PM

## 2017-03-21 ENCOUNTER — Encounter (HOSPITAL_COMMUNITY): Payer: Self-pay | Admitting: Orthopedic Surgery

## 2017-03-21 LAB — CBC WITH DIFFERENTIAL/PLATELET
BASOS ABS: 0 10*3/uL (ref 0.0–0.1)
Basophils Relative: 0 %
Eosinophils Absolute: 0 10*3/uL (ref 0.0–0.7)
Eosinophils Relative: 0 %
HEMATOCRIT: 25.2 % — AB (ref 36.0–46.0)
HEMOGLOBIN: 8.3 g/dL — AB (ref 12.0–15.0)
Lymphocytes Relative: 25 %
Lymphs Abs: 2.3 10*3/uL (ref 0.7–4.0)
MCH: 29.3 pg (ref 26.0–34.0)
MCHC: 32.9 g/dL (ref 30.0–36.0)
MCV: 89 fL (ref 78.0–100.0)
Monocytes Absolute: 0.7 10*3/uL (ref 0.1–1.0)
Monocytes Relative: 8 %
NEUTROS ABS: 6.2 10*3/uL (ref 1.7–7.7)
NEUTROS PCT: 67 %
Platelets: 137 10*3/uL — ABNORMAL LOW (ref 150–400)
RBC: 2.83 MIL/uL — ABNORMAL LOW (ref 3.87–5.11)
RDW: 12.7 % (ref 11.5–15.5)
WBC: 9.2 10*3/uL (ref 4.0–10.5)

## 2017-03-21 LAB — COMPREHENSIVE METABOLIC PANEL
ALT: 13 U/L — ABNORMAL LOW (ref 14–54)
ANION GAP: 8 (ref 5–15)
AST: 21 U/L (ref 15–41)
Albumin: 2.9 g/dL — ABNORMAL LOW (ref 3.5–5.0)
Alkaline Phosphatase: 46 U/L (ref 38–126)
BUN: 10 mg/dL (ref 6–20)
CO2: 26 mmol/L (ref 22–32)
Calcium: 8.7 mg/dL — ABNORMAL LOW (ref 8.9–10.3)
Chloride: 100 mmol/L — ABNORMAL LOW (ref 101–111)
Creatinine, Ser: 0.59 mg/dL (ref 0.44–1.00)
GFR calc non Af Amer: >60 mL/min (ref >60)
Glucose, Bld: 115 mg/dL — ABNORMAL HIGH (ref 65–99)
Potassium: 3.7 mmol/L (ref 3.5–5.1)
SODIUM: 134 mmol/L — AB (ref 135–145)
TOTAL PROTEIN: 5.1 g/dL — AB (ref 6.5–8.1)
Total Bilirubin: 0.7 mg/dL (ref 0.3–1.2)

## 2017-03-21 LAB — VITAMIN D 25 HYDROXY (VIT D DEFICIENCY, FRACTURES): VIT D 25 HYDROXY: 29.4 ng/mL — AB (ref 30.0–100.0)

## 2017-03-21 MED ORDER — ALPRAZOLAM 0.25 MG PO TABS: 0.2500 mg | ORAL_TABLET | Freq: Two times a day (BID) | ORAL | Status: DC | PRN

## 2017-03-21 MED ORDER — ACETAMINOPHEN 500 MG PO TABS
1000.0000 mg | ORAL_TABLET | Freq: Three times a day (TID) | ORAL | Status: DC
  Administered 2017-03-21 – 2017-03-22 (×4): 1000 mg via ORAL
  Filled 2017-03-21 (×4): qty 2

## 2017-03-21 NOTE — NC FL2 (Signed)
[place] LEVEL OF CARE SCREENING TOOL     IDENTIFICATION  Patient Name: Sara Bowman Birthdate: [birth year] Sex: female Admission Date (Current Location): 03/19/2017  [facility] and [place] Number:  Herbalist and Address:  [facility], [place] [address], [place], [place] [address]      Provider Number: [id number]  Attending Physician Name and Address:  Mendel Corning, MD  Relative Name and Phone Number:  Marcelline Deist- [phone], daughter    Current Level of Care: Hospital Recommended Level of Care: [place] Prior Approval Number:    Date Approved/Denied:   PASRR Number: [id number]  Discharge Plan: SNF    Current Diagnoses: Patient Active Problem List   Diagnosis Date Noted  . Closed intertrochanteric fracture of left hip ([place]) 03/19/2017  . Dementia 03/19/2017  . Gait instability 03/19/2017  . Acute blood loss anemia 10/15/2014  . Hip fracture ([place]) 10/12/2014  . Malnutrition of moderate degree ([place]) 08/19/2014  . Altered mental status 08/18/2014  . Elevated troponin 08/17/2014  . Altered mental state 08/17/2014  . Physical deconditioning 08/17/2014  . Hyponatremia 08/17/2014  . Hypertension 08/17/2014  . Dementia with behavioral disturbance 08/17/2014    Orientation RESPIRATION BLADDER Height & Weight     Self  Normal Indwelling catheter Weight: 112 lb (50.8 kg) Height:  5\' 2"  (157.5 cm)  BEHAVIORAL SYMPTOMS/MOOD NEUROLOGICAL BOWEL NUTRITION STATUS      Continent Diet(See DC Summary)  AMBULATORY STATUS COMMUNICATION OF NEEDS Skin   Extensive Assist Verbally Surgical wounds                       Personal Care Assistance Level of Assistance  Bathing, Dressing, Feeding Bathing Assistance: Maximum assistance Feeding assistance: Limited assistance Dressing Assistance: Maximum assistance     Functional Limitations Info  Sight, Hearing, Speech Sight Info: Adequate Hearing Info:  Adequate Speech Info: Adequate    SPECIAL CARE FACTORS FREQUENCY  PT (By licensed PT), OT (By licensed OT)     PT Frequency: 5x wk OT Frequency: 5x wk            Contractures Contractures Info: Not present    Additional Factors Info  Code Status, Allergies Code Status Info: Full Code Allergies Info: No Known Allergies           Current Medications (03/21/2017):  This is the current hospital active medication list Current Facility-Administered Medications  Medication Dose Route Frequency Provider Last Rate Last Dose  . acetaminophen (TYLENOL) tablet 650 mg  650 mg Oral Q6H PRN Marchia Bond, MD   650 mg at 03/21/17 0703   Or  . acetaminophen (TYLENOL) suppository 650 mg  650 mg Rectal Q6H PRN Marchia Bond, MD      . acidophilus (RISAQUAD) capsule 1 capsule  1 capsule Oral Daily Marchia Bond, MD   1 capsule at 03/21/17 1031  . alum & mag hydroxide-simeth (MAALOX/MYLANTA) 200-200-20 MG/5ML suspension 30 mL  30 mL Oral Q4H PRN Marchia Bond, MD      . bisacodyl (DULCOLAX) suppository 10 mg  10 mg Rectal Daily PRN Johnson, Clanford L, MD      . docusate sodium (COLACE) capsule 100 mg  100 mg Oral BID Marchia Bond, MD   100 mg at 03/21/17 1030  . enoxaparin (LOVENOX) injection 40 mg  40 mg Subcutaneous Q24H Johnson, Clanford L, MD   40 mg at 03/21/17 1030  . feeding supplement (ENSURE ENLIVE) (ENSURE ENLIVE) liquid  237 mL  237 mL Oral BID BM Marchia Bond, MD   237 mL at 03/21/17 1029  . ferrous sulfate tablet 325 mg  325 mg Oral TID Emiliano Dyer, MD   325 mg at 03/21/17 1030  . HYDROcodone-acetaminophen (NORCO/VICODIN) 5-325 MG per tablet 1 tablet  1 tablet Oral Q6H PRN Rai, Ripudeep K, MD   1 tablet at 03/21/17 1148  . LORazepam (ATIVAN) tablet 0.5 mg  0.5 mg Oral Q6H PRN Kirby-Graham, Karsten Fells, NP      . magnesium citrate solution 1 Bottle  1 Bottle Oral Once PRN Marchia Bond, MD      . menthol-cetylpyridinium (CEPACOL) lozenge 3 mg  1 lozenge Oral PRN Marchia Bond, MD       Or  . phenol (CHLORASEPTIC) mouth spray 1 spray  1 spray Mouth/Throat PRN Marchia Bond, MD      . ondansetron [facility]) tablet 4 mg  4 mg Oral Q6H PRN Marchia Bond, MD       Or  . ondansetron [facility]) injection 4 mg  4 mg Intravenous Q6H PRN Marchia Bond, MD      . polyethylene glycol (MIRALAX / GLYCOLAX) packet 17 g  17 g Oral Daily PRN Marchia Bond, MD   17 g at 03/20/17 2047  . senna (SENOKOT) tablet 8.6 mg  1 tablet Oral BID Marchia Bond, MD   8.6 mg at 03/21/17 1029  . senna-docusate (Senokot-S) tablet 1 tablet  1 tablet Oral QHS PRN [name], MD         Discharge Medications: Please see discharge summary for a list of discharge medications.  Relevant Imaging Results:  Relevant Lab Results:   Additional Information SS#: [place], LCSW

## 2017-03-21 NOTE — Progress Notes (Signed)
[facility]                                                                              Patient Demographics  Sara Bowman, is a 84 y.o. female, DOB - February 05, 1923, WGN:[id number]  Admit date - 03/19/2017   Admitting Physician [name], MD  Outpatient Primary MD for the patient is Sinda Du, MD  Outpatient specialists:   LOS - 2  days   Medical records reviewed and are as summarized below:    Chief Complaint  Patient presents with  . Fall       Brief summary      Assessment & Plan    Principal Problem:   Closed intertrochanteric fracture of left hip (HCC) -Presented with witnessed fall at home, left hip intertrochanteric fracture. -Orthopedics consulted, patient underwent OR on 2/26 with intramedullary nail, postop day #2 -Pain control, DVT prophylaxis per orthopedics - Patient is too somnolent although arousable. Discontinued narcotics, will keep on scheduled Tylenol today.  - Hemoglobin 8.3, monitor closely   Active Problems:   Physical deconditioning -Will need skilled nursing facility, social worker aware    Hyponatremia - Improved, likely from mild dehydration      Hypertension - BP stable.  - For now hold lisinopril, Norvasc        Dementia with behavioral disturbance - Discontinue narcotics, better sleep hygiene, PT OT    Malnutrition of moderate degree ([place]) -Nutrition consult  Code Status: Full code DVT Prophylaxis:  Lovenox  Family Communication: No family member at the bedside   Disposition Plan: Will need skilled nursing facility. Possible DC in a.m. if facility available Time Spent in minutes 25 minutes  Procedures:  Left intramedullary nail on 2/26  Consultants:   Orthopedics  Antimicrobials:   None   Medications  Scheduled Meds: . acetaminophen  1,000 mg Oral Q8H  . acidophilus  1 capsule Oral Daily  . docusate sodium  100 mg Oral BID  . enoxaparin (LOVENOX) injection  40 mg  Subcutaneous Q24H  . feeding supplement (ENSURE ENLIVE)  237 mL Oral BID BM  . ferrous sulfate  325 mg Oral TID PC  . senna  1 tablet Oral BID   Continuous Infusions:  PRN Meds:.acetaminophen **OR** acetaminophen, ALPRAZolam, alum & mag hydroxide-simeth, bisacodyl, magnesium citrate, menthol-cetylpyridinium **OR** phenol, ondansetron **OR** ondansetron (ZOFRAN) IV, polyethylene glycol, senna-docusate   Antibiotics   Anti-infectives (From admission, onward)   Start     Dose/Rate Route Frequency Ordered Stop   03/20/17 0600  ceFAZolin (ANCEF) IVPB 2g/100 mL premix     2 g 200 mL/hr over 30 Minutes Intravenous On call to O.R. 03/19/17 1634 03/19/17 1730   03/19/17 2330  ceFAZolin (ANCEF) IVPB 2g/100 mL premix     2 g 200 mL/hr over 30 Minutes Intravenous Every 6 hours 03/19/17 2119 03/20/17 0533   03/19/17 1635  ceFAZolin (ANCEF) 2-4 GM/100ML-% IVPB    Comments:  Scronce, Trina   : cabinet override      03/19/17 1635 03/19/17 1730        Subjective:   Sara Bowman was seen and examined today. Arousable but still somnolent.  Pain is moderate. Difficult to obtain review of systems from the patient. No acute fevers or chills.   Objective:   Vitals:   03/20/17 1246 03/20/17 1651 03/20/17 2149 03/21/17 0535  BP: (!) 105/47 (!) 123/53 (!) 110/50 (!) 136/54  Pulse: (!) 103 93 (!) 105 91  Resp:   19 20  Temp: 98.5 F (36.9 C) 98.4 F (36.9 C) 98.4 F (36.9 C) 98.3 F (36.8 C)  TempSrc: Oral Oral Oral Oral  SpO2: 98% 98% 98% 97%  Weight:      Height:        Intake/Output Summary (Last 24 hours) at 03/21/2017 1213 Last data filed at 03/21/2017 0900 Gross per 24 hour  Intake 1632.5 ml  Output 1700 ml  Net -67.5 ml     Wt Readings from Last 3 Encounters:  03/19/17 50.8 kg (112 lb)  01/03/17 45.4 kg (100 lb)  08/31/16 49.9 kg (110 lb)     Exam   General: Somnolent but arousable, NAD  Eyes:   HEENT:    Cardiovascular: S1 S2 clear, RRR No pedal edema  b/l  Respiratory: Clear to auscultation bilaterally, no wheezing, rales or rhonchi  Gastrointestinal: Soft, nontender, nondistended, + bowel sounds  Ext: no pedal edema bilaterally  Neuro: not cooperative  Musculoskeletal: No digital cyanosis, clubbing  Skin: No rashes  Psych: somnolent   Data Reviewed:  I have personally reviewed following labs and imaging studies  Micro Results No results found for this or any previous visit (from the past 240 hour(s)).  Radiology Reports Dg Chest 1 View  Result Date: 03/19/2017 CLINICAL DATA:  Left hip fracture. EXAM: CHEST 1 VIEW COMPARISON:  10/12/2014 FINDINGS: Mild cardiac enlargement. Aortic atherosclerosis noted. Diffuse coarsened interstitial markings are identified throughout both lungs. No superimposed pleural effusion, pulmonary edema or airspace consolidation. IMPRESSION: 1. Chronic interstitial coarsening without superimposed acute abnormality. 2.  Aortic Atherosclerosis (ICD10-I70.0). 3. Mild cardiac enlargement. Electronically Signed   By: Kerby Moors M.D.   On: 03/19/2017 09:28   Pelvis Portable  Result Date: 03/19/2017 CLINICAL DATA:  Status post ORIF of fracture EXAM: PORTABLE PELVIS 1-2 VIEWS COMPARISON:  03/19/2017 FINDINGS: Prior intramedullary rod and screw fixation of right femur. Pubic symphysis and rami are intact. Interval intramedullary rod fixation of the left femur for intertrochanteric fracture. Small amount of soft tissue gas lateral hip and proximal thigh. Vascular calcifications. IMPRESSION: Intramedullary rod fixation of comminuted left intertrochanteric fracture with expected postsurgical changes Electronically Signed   By: Donavan Foil M.D.   On: 03/19/2017 20:23   Dg Hip Unilat W Or Wo Pelvis 2-3 Views Left  Result Date: 03/19/2017 CLINICAL DATA:  Left hip pain.  Status post fall. EXAM: DG HIP (WITH OR WITHOUT PELVIS) 2-3V LEFT COMPARISON:  None. FINDINGS: Generalized osteopenia. Comminuted and mildly  displaced left intertrochanteric fracture. No hip dislocation. Prior right intertrochanteric ORIF with a healed intertrochanteric fracture. Subchondral sclerosis in the right femoral head most concerning for avascular necrosis. No aggressive osseous lesion. IMPRESSION: 1. Acute comminuted and mildly displaced left intertrochanteric fracture. Electronically Signed   By: Kathreen Devoid   On: 03/19/2017 09:28   Dg Femur Min 2 Views Left  Result Date: 03/19/2017 CLINICAL DATA:  Internal fixation EXAM: LEFT FEMUR 2 VIEWS COMPARISON:  03/19/2017 FINDINGS: Placement of intramedullary nail and dynamic hip screw across the left femoral intertrochanteric fracture. Anatomic alignment. No hardware complicating feature. IMPRESSION: Internal fixation.  Anatomic alignment without complicating feature. Electronically Signed   By: Rolm Baptise M.D.  On: 03/19/2017 20:20    Lab Data:  CBC: Recent Labs  Lab 03/19/17 0820 03/19/17 2148 03/20/17 0655 03/21/17 0700  WBC 5.0 9.3 7.1 9.2  NEUTROABS 3.5  --  5.5 6.2  HGB 13.2 9.7* 8.4* 8.3*  HCT 39.3 27.7* 24.8* 25.2*  MCV 88.9 87.9 89.2 89.0  PLT 187 146* 139* 027*   Basic Metabolic Panel: Recent Labs  Lab 03/19/17 0820 03/19/17 2148 03/20/17 0655 03/21/17 0700  NA 131*  --  130* 134*  K 3.8  --  3.7 3.7  CL 96*  --  97* 100*  CO2 27  --  25 26  GLUCOSE 106*  --  148* 115*  BUN 11  --  8 10  CREATININE 0.58 0.53 0.64 0.59  CALCIUM 9.5  --  8.5* 8.7*   GFR: Estimated Creatinine Clearance: 36.2 mL/min (by C-G formula based on SCr of 0.59 mg/dL). Liver Function Tests: Recent Labs  Lab 03/19/17 0820 03/20/17 0655 03/21/17 0700  AST 23 21 21   ALT 16 13* 13*  ALKPHOS 72 47 46  BILITOT 0.6 0.6 0.7  PROT 6.7 4.9* 5.1*  ALBUMIN 3.9 3.0* 2.9*   No results for input(s): LIPASE, AMYLASE in the last 168 hours. No results for input(s): AMMONIA in the last 168 hours. Coagulation Profile: No results for input(s): INR, PROTIME in the last 168  hours. Cardiac Enzymes: No results for input(s): CKTOTAL, CKMB, CKMBINDEX, TROPONINI in the last 168 hours. BNP (last 3 results) No results for input(s): PROBNP in the last 8760 hours. HbA1C: No results for input(s): HGBA1C in the last 72 hours. CBG: No results for input(s): GLUCAP in the last 168 hours. Lipid Profile: No results for input(s): CHOL, HDL, LDLCALC, TRIG, CHOLHDL, LDLDIRECT in the last 72 hours. Thyroid Function Tests: No results for input(s): TSH, T4TOTAL, FREET4, T3FREE, THYROIDAB in the last 72 hours. Anemia Panel: No results for input(s): VITAMINB12, FOLATE, FERRITIN, TIBC, IRON, RETICCTPCT in the last 72 hours. Urine analysis:    Component Value Date/Time   COLORURINE YELLOW 03/20/2017 1231   APPEARANCEUR CLEAR 03/20/2017 1231   LABSPEC 1.017 03/20/2017 1231   PHURINE 6.0 03/20/2017 1231   GLUCOSEU NEGATIVE 03/20/2017 1231   HGBUR NEGATIVE 03/20/2017 1231   BILIRUBINUR NEGATIVE 03/20/2017 1231   KETONESUR NEGATIVE 03/20/2017 1231   PROTEINUR NEGATIVE 03/20/2017 1231   UROBILINOGEN 0.2 10/12/2014 2217   NITRITE NEGATIVE 03/20/2017 1231   LEUKOCYTESUR NEGATIVE 03/20/2017 [place] M.D. [facility] 03/21/2017, 12:13 PM  Pager: [phone] Between 7am to 7pm - call Pager - [phone]  After 7pm go to [email or web address] - password [email or web address]  Call night coverage person covering after 7pm

## 2017-03-21 NOTE — Social Work (Signed)
CSW confirmed SNF bed with admission staff, Tammy, at [facility].  CSW then confirmed SNF placement with daughter Hilda Blades for tomorrow. CSW will set up transport and complete disposition when ready.  CSW will continue to follow.  Elissa Hefty, LCSW Clinical Social Worker [phone]

## 2017-03-21 NOTE — Progress Notes (Addendum)
Patient ID: Sara Bowman, female   DOB: Dec 05, 1923, 84 y.o.   MRN: [id number]     Subjective:  Patient reports pain as mild to moderate.  Patient in bed and sleeping did wake and follow commands   Objective:   VITALS:   Vitals:   03/20/17 1651 03/20/17 2149 03/21/17 0535 03/21/17 1300  BP: (!) 123/53 (!) 110/50 (!) 136/54 (!) 95/46  Pulse: 93 (!) 105 91 97  Resp:  19 20 20   Temp: 98.4 F (36.9 C) 98.4 F (36.9 C) 98.3 F (36.8 C) 98.1 F (36.7 C)  TempSrc: Oral Oral Oral Oral  SpO2: 98% 98% 97% 100%  Weight:      Height:        ABD soft Sensation intact distally Dorsiflexion/Plantar flexion intact Incision: dressing C/D/I and no drainage   Lab Results  Component Value Date   WBC 9.2 03/21/2017   HGB 8.3 (L) 03/21/2017   HCT 25.2 (L) 03/21/2017   MCV 89.0 03/21/2017   PLT 137 (L) 03/21/2017   BMET    Component Value Date/Time   NA 134 (L) 03/21/2017 0700   K 3.7 03/21/2017 0700   CL 100 (L) 03/21/2017 0700   CO2 26 03/21/2017 0700   GLUCOSE 115 (H) 03/21/2017 0700   BUN 10 03/21/2017 0700   CREATININE 0.59 03/21/2017 0700   CALCIUM 8.7 (L) 03/21/2017 0700   GFRNONAA >60 03/21/2017 0700   GFRAA >60 03/21/2017 0700     Assessment/Plan: 2 Days Post-Op   Principal Problem:   Closed intertrochanteric fracture of left hip (HCC) Active Problems:   Physical deconditioning   Hyponatremia   Hypertension   Dementia with behavioral disturbance   Malnutrition of moderate degree (HCC)   Dementia   Gait instability   Advance diet Up with therapy  ABLA, seems ok to observe Continue plan per medicine WBAT Dry dressing PRN   Sara Bowman 03/21/2017, 4:15 PM  Discussed and agree with above.  Weightbearing: WBAT LLE Insicional and dressing care: Dressings left intact until follow-up Orthopedic device(s): None Showering: keep wounds dry with press and seal saran wrap VTE prophylaxis: Lovenox 40mg  qd for 1 month Pain control: tylenol Follow - up  plan: 2 weeks     Marchia Bond, MD Cell [phone]

## 2017-03-22 DIAGNOSIS — F0390 Unspecified dementia without behavioral disturbance: Secondary | ICD-10-CM

## 2017-03-22 DIAGNOSIS — W19XXXD Unspecified fall, subsequent encounter: Secondary | ICD-10-CM

## 2017-03-22 LAB — COMPREHENSIVE METABOLIC PANEL
ALK PHOS: 44 U/L (ref 38–126)
ALT: 12 U/L — ABNORMAL LOW (ref 14–54)
ANION GAP: 7 (ref 5–15)
AST: 22 U/L (ref 15–41)
Albumin: 2.6 g/dL — ABNORMAL LOW (ref 3.5–5.0)
BUN: 10 mg/dL (ref 6–20)
CALCIUM: 8.5 mg/dL — AB (ref 8.9–10.3)
CO2: 28 mmol/L (ref 22–32)
Chloride: 97 mmol/L — ABNORMAL LOW (ref 101–111)
Creatinine, Ser: 0.59 mg/dL (ref 0.44–1.00)
Glucose, Bld: 111 mg/dL — ABNORMAL HIGH (ref 65–99)
Potassium: 3.8 mmol/L (ref 3.5–5.1)
Sodium: 132 mmol/L — ABNORMAL LOW (ref 135–145)
Total Bilirubin: 0.7 mg/dL (ref 0.3–1.2)
Total Protein: 4.9 g/dL — ABNORMAL LOW (ref 6.5–8.1)

## 2017-03-22 LAB — CBC WITH DIFFERENTIAL/PLATELET
BASOS PCT: 0 %
Basophils Absolute: 0 10*3/uL (ref 0.0–0.1)
EOS ABS: 0.1 10*3/uL (ref 0.0–0.7)
Eosinophils Relative: 1 %
HEMATOCRIT: 22.2 % — AB (ref 36.0–46.0)
HEMOGLOBIN: 7.7 g/dL — AB (ref 12.0–15.0)
Lymphocytes Relative: 24 %
Lymphs Abs: 1.7 10*3/uL (ref 0.7–4.0)
MCH: 31 pg (ref 26.0–34.0)
MCHC: 34.7 g/dL (ref 30.0–36.0)
MCV: 89.5 fL (ref 78.0–100.0)
MONOS PCT: 6 %
Monocytes Absolute: 0.4 10*3/uL (ref 0.1–1.0)
NEUTROS ABS: 5 10*3/uL (ref 1.7–7.7)
NEUTROS PCT: 69 %
Platelets: 143 10*3/uL — ABNORMAL LOW (ref 150–400)
RBC: 2.48 MIL/uL — AB (ref 3.87–5.11)
RDW: 12.9 % (ref 11.5–15.5)
WBC: 7.3 10*3/uL (ref 4.0–10.5)

## 2017-03-22 LAB — HEMOGLOBIN AND HEMATOCRIT, BLOOD
HEMATOCRIT: 29.6 % — AB (ref 36.0–46.0)
Hemoglobin: 10.1 g/dL — ABNORMAL LOW (ref 12.0–15.0)

## 2017-03-22 LAB — PREPARE RBC (CROSSMATCH)

## 2017-03-22 MED ORDER — SODIUM CHLORIDE 0.9 % IV SOLN: Freq: Once | INTRAVENOUS | Status: DC

## 2017-03-22 MED ORDER — DOCUSATE SODIUM 100 MG PO CAPS: 100.0000 mg | ORAL_CAPSULE | Freq: Two times a day (BID) | ORAL | 0 refills | Status: AC

## 2017-03-22 MED ORDER — AMLODIPINE BESYLATE 5 MG PO TABS: 5.0000 mg | ORAL_TABLET | Freq: Every day | ORAL | Status: AC

## 2017-03-22 MED ORDER — POLYETHYLENE GLYCOL 3350 17 G PO PACK: 17.0000 g | PACK | Freq: Every day | ORAL | 0 refills | Status: AC | PRN

## 2017-03-22 MED ORDER — ALPRAZOLAM 0.25 MG PO TABS: 0.2500 mg | ORAL_TABLET | Freq: Two times a day (BID) | ORAL | 0 refills | Status: AC | PRN

## 2017-03-22 MED ORDER — LISINOPRIL 20 MG PO TABS: 20.0000 mg | ORAL_TABLET | Freq: Every day | ORAL | Status: AC

## 2017-03-22 NOTE — Progress Notes (Signed)
Physical Therapy Treatment Patient Details Name: Sara Bowman MRN: [id number] DOB: 1923-06-22 Today's Date: 03/22/2017    History of Present Illness Sara Bowman is a 84 y.o. female who complains of acute left hip pain after mechanical fall. Intertrochanteric fx, now s/p IMNail, WBAT;  has a past medical history of Cancer ([place]), Confusion, Dementia, and Hypertension. R hip fx in 9/18, IMNail    PT Comments    Patient required max A +2 for bed mobility and stand pivot transfer EOB to recliner. Pt is HOH and therapist attempted use of written cues however pt unable to read them. Pt reported needing her glasses to read however this therapist did not see glasses in pt's room. Pt disoriented to situation and why her L LE is so painful and pt reoriented. Continue to progress as tolerated with anticipated d/c to SNF for further skilled PT services.     Follow Up Recommendations  SNF;Supervision/Assistance - 24 hour     Equipment Recommendations  Rolling walker with 5" wheels;3in1 (PT)    Recommendations for Other Services       Precautions / Restrictions Precautions Precautions: Fall Restrictions Weight Bearing Restrictions: Yes LLE Weight Bearing: Weight bearing as tolerated    Mobility  Bed Mobility Overal bed mobility: Needs Assistance Bed Mobility: Supine to Sit     Supine to sit: Max assist;+2 for physical assistance     General bed mobility comments: multimodal cues for sequencing; pt assisted with bring bilat LE to EOB; use of bed pad to scoot hips and assist to elevate trunk into sitting  Transfers Overall transfer level: Needs assistance Equipment used: 2 person hand held assist Transfers: Stand Pivot Transfers;Sit to/from Stand Sit to Stand: Max assist;+2 physical assistance;From elevated surface Stand pivot transfers: Max assist;+2 physical assistance       General transfer comment: assist to power up into standing with use of gait belt and bed pad to lift  hips; pt able to take pivotal steps EOB to recliner with +2 assist and bilat knee blocked  Ambulation/Gait                 Stairs            Wheelchair Mobility    Modified Rankin (Stroke Patients Only)       Balance Overall balance assessment: Needs assistance   Sitting balance-Leahy Scale: Fair       Standing balance-Leahy Scale: Zero                              Cognition Arousal/Alertness: Awake/alert Behavior During Therapy: WFL for tasks assessed/performed Overall Cognitive Status: History of cognitive impairments - at baseline(pt disoriented to situation and why L LE is painful)                                 General Comments: Advanced dementia; very HOH and with visual deficits(attempted use of written cues however pt unable to read them)      Exercises      General Comments        Pertinent Vitals/Pain Pain Assessment: Faces Faces Pain Scale: Hurts whole lot Pain Location: L hip with bed mobitliy and transfer Pain Descriptors / Indicators: Grimacing;Guarding;Moaning;Sore Pain Intervention(s): Limited activity within patient's tolerance;Monitored during session;Repositioned    Home Living  Prior Function            PT Goals (current goals can now be found in the care plan section) Acute Rehab PT Goals PT Goal Formulation: Patient unable to participate in goal setting Time For Goal Achievement: 04/03/17 Potential to Achieve Goals: Good Progress towards PT goals: Progressing toward goals    Frequency    Min 2X/week      PT Plan Current plan remains appropriate    Co-evaluation              AM-PAC PT "6 Clicks" Daily Activity  Outcome Measure  Difficulty turning over in bed (including adjusting bedclothes, sheets and blankets)?: Unable Difficulty moving from lying on back to sitting on the side of the bed? : Unable Difficulty sitting down on and standing up from  a chair with arms (e.g., wheelchair, bedside commode, etc,.)?: Unable Help needed moving to and from a bed to chair (including a wheelchair)?: Total Help needed walking in hospital room?: Total Help needed climbing 3-5 steps with a railing? : Total 6 Click Score: 6    End of Session Equipment Utilized During Treatment: Gait belt Activity Tolerance: Patient tolerated treatment well(though painful) Patient left: in chair;with call bell/phone within reach;with chair alarm set Nurse Communication: Mobility status PT Visit Diagnosis: Other abnormalities of gait and mobility (R26.89);Pain Pain - Right/Left: Left Pain - part of body: Hip     Time: 1126-1140 PT Time Calculation (min) (ACUTE ONLY): 14 min  Charges:  $Therapeutic Activity: 8-22 mins                    G Codes:       Earney Navy, PTA Pager: [phone]     Darliss Cheney 03/22/2017, 2:05 PM

## 2017-03-22 NOTE — Plan of Care (Signed)
  Progressing Education: Knowledge of General Education information will improve 03/22/2017 0956 - Progressing by Rance Muir, RN Health Behavior/Discharge Planning: Ability to manage health-related needs will improve 03/22/2017 0956 - Progressing by Rance Muir, RN Clinical Measurements: Ability to maintain clinical measurements within normal limits will improve 03/22/2017 0956 - Progressing by Rance Muir, RN Will remain free from infection 03/22/2017 0956 - Progressing by Rance Muir, RN Diagnostic test results will improve 03/22/2017 0956 - Progressing by Rance Muir, RN Respiratory complications will improve 03/22/2017 0956 - Progressing by Rance Muir, RN Cardiovascular complication will be avoided 03/22/2017 0956 - Progressing by Rance Muir, RN Activity: Risk for activity intolerance will decrease 03/22/2017 0956 - Progressing by Rance Muir, RN Nutrition: Adequate nutrition will be maintained 03/22/2017 0956 - Progressing by Rance Muir, RN Coping: Level of anxiety will decrease 03/22/2017 0956 - Progressing by Rance Muir, RN Elimination: Will not experience complications related to bowel motility 03/22/2017 0956 - Progressing by Rance Muir, RN Will not experience complications related to urinary retention 03/22/2017 0956 - Progressing by Rance Muir, RN Pain Managment: General experience of comfort will improve 03/22/2017 0956 - Progressing by Rance Muir, RN Safety: Ability to remain free from injury will improve 03/22/2017 0956 - Progressing by Rance Muir, RN Skin Integrity: Risk for impaired skin integrity will decrease 03/22/2017 0956 - Progressing by Rance Muir, RN Education: Verbalization of understanding the information provided (i.e., activity precautions, restrictions, etc) will improve 03/22/2017 0956 - Progressing by Rance Muir, RN Activity: Ability to ambulate and perform ADLs will improve 03/22/2017 0956 - Progressing by Rance Muir, RN Clinical Measurements: Postoperative complications will be avoided or  minimized 03/22/2017 0956 - Progressing by Rance Muir, RN Self-Concept: Ability to maintain and perform role responsibilities to the fullest extent possible will improve 03/22/2017 0956 - Progressing by Rance Muir, RN Pain Management: Pain level will decrease 03/22/2017 0956 - Progressing by Rance Muir, RN

## 2017-03-22 NOTE — Plan of Care (Signed)
  Adequate for Discharge Education: Knowledge of General Education information will improve 03/22/2017 1703 - Adequate for Discharge by Rance Muir, RN 03/22/2017 0956 - Progressing by Rance Muir, RN Health Behavior/Discharge Planning: Ability to manage health-related needs will improve 03/22/2017 1703 - Adequate for Discharge by Rance Muir, RN 03/22/2017 0956 - Progressing by Rance Muir, RN Clinical Measurements: Ability to maintain clinical measurements within normal limits will improve 03/22/2017 1703 - Adequate for Discharge by Rance Muir, RN 03/22/2017 0956 - Progressing by Rance Muir, RN Will remain free from infection 03/22/2017 1703 - Adequate for Discharge by Rance Muir, RN 03/22/2017 0956 - Progressing by Rance Muir, RN Diagnostic test results will improve 03/22/2017 1703 - Adequate for Discharge by Rance Muir, RN 03/22/2017 0956 - Progressing by Rance Muir, RN Respiratory complications will improve 03/22/2017 1703 - Adequate for Discharge by Rance Muir, RN 03/22/2017 0956 - Progressing by Rance Muir, RN Cardiovascular complication will be avoided 03/22/2017 1703 - Adequate for Discharge by Rance Muir, RN 03/22/2017 0956 - Progressing by Rance Muir, RN Activity: Risk for activity intolerance will decrease 03/22/2017 1703 - Adequate for Discharge by Rance Muir, RN 03/22/2017 0956 - Progressing by Rance Muir, RN Nutrition: Adequate nutrition will be maintained 03/22/2017 1703 - Adequate for Discharge by Rance Muir, RN 03/22/2017 0956 - Progressing by Rance Muir, RN Coping: Level of anxiety will decrease 03/22/2017 1703 - Adequate for Discharge by Rance Muir, RN 03/22/2017 0956 - Progressing by Rance Muir, RN Elimination: Will not experience complications related to bowel motility 03/22/2017 1703 - Adequate for Discharge by Rance Muir, RN 03/22/2017 0956 - Progressing by Rance Muir, RN Will not experience complications related to urinary retention 03/22/2017 1703 - Adequate for Discharge by Rance Muir, RN 03/22/2017 0956 - Progressing by  Rance Muir, RN Pain Managment: General experience of comfort will improve 03/22/2017 1703 - Adequate for Discharge by Rance Muir, RN 03/22/2017 0956 - Progressing by Rance Muir, RN Safety: Ability to remain free from injury will improve 03/22/2017 1703 - Adequate for Discharge by Rance Muir, RN 03/22/2017 0956 - Progressing by Rance Muir, RN Skin Integrity: Risk for impaired skin integrity will decrease 03/22/2017 1703 - Adequate for Discharge by Rance Muir, RN 03/22/2017 0956 - Progressing by Rance Muir, RN Education: Verbalization of understanding the information provided (i.e., activity precautions, restrictions, etc) will improve 03/22/2017 1703 - Adequate for Discharge by Rance Muir, RN 03/22/2017 0956 - Progressing by Rance Muir, RN Activity: Ability to ambulate and perform ADLs will improve 03/22/2017 1703 - Adequate for Discharge by Rance Muir, RN 03/22/2017 0956 - Progressing by Rance Muir, RN Clinical Measurements: Postoperative complications will be avoided or minimized 03/22/2017 1703 - Adequate for Discharge by Rance Muir, RN 03/22/2017 0956 - Progressing by Rance Muir, RN Self-Concept: Ability to maintain and perform role responsibilities to the fullest extent possible will improve 03/22/2017 1703 - Adequate for Discharge by Rance Muir, RN 03/22/2017 0956 - Progressing by Rance Muir, RN Pain Management: Pain level will decrease 03/22/2017 1703 - Adequate for Discharge by Rance Muir, RN 03/22/2017 [phone] - Progressing by Rance Muir, RN

## 2017-03-22 NOTE — Clinical Social Work Note (Signed)
Clinical Social Worker facilitated patient discharge including contacting patient family and facility to confirm patient discharge plans.  Clinical information faxed to facility and family agreeable with plan.  CSW arranged ambulance transport via [facility] to [place] [facility] [place].  RN to call [phone] for report prior to discharge.  Clinical Social Worker will sign off for now as social work intervention is no longer needed. Please consult [place] again if new need arises.  [place], [place]

## 2017-03-22 NOTE — Clinical Social Work Placement (Signed)
   CLINICAL SOCIAL WORK PLACEMENT  NOTE  Date:  03/22/2017  Patient Details  Name: Sara Bowman MRN: [id number] Date of Birth: [birth year]  Clinical Social Work is seeking post-discharge placement for this patient at the [place] level of care (*CSW will initial, date and re-position this form in  chart as items are completed):  Yes   Patient/family provided with [facility] Work Department's list of facilities offering this level of care within the geographic area requested by the patient (or if unable, by the patient's family).  Yes   Patient/family informed of their freedom to choose among providers that offer the needed level of care, that participate in Medicare, Medicaid or managed care program needed by the patient, have an available bed and are willing to accept the patient.  Yes   Patient/family informed of [facility]'s ownership interest in [facility] and [facility], as well as of the fact that they are under no obligation to receive care at these facilities.  PASRR submitted to EDS on       PASRR number received on       Existing PASRR number confirmed on 03/21/17     FL2 transmitted to all facilities in geographic area requested by pt/family on       FL2 transmitted to all facilities within larger geographic area on 03/21/17     Patient informed that his/her managed care company has contracts with or will negotiate with certain facilities, including the following:        Yes   Patient/family informed of bed offers received.  Patient chooses bed at [facility]     Physician recommends and patient chooses bed at      Patient to be transferred to [facility] [place] on 03/22/17.  Patient to be transferred to facility by [facility]     Patient family notified on 03/22/17 of transfer.  Name of family member notified:  contacted daughter Hilda Blades     PHYSICIAN       Additional Comment:     _______________________________________________ Eileen Stanford, LCSW 03/22/2017, 2:54 PM

## 2017-03-22 NOTE — Care Management Important Message (Signed)
Important Message  Patient Details  Name: Sara Bowman MRN: [id number] Date of Birth: [birth year]   Medicare Important Message Given:  Yes    Orbie Pyo 03/22/2017, 3:29 PM

## 2017-03-22 NOTE — Discharge Summary (Addendum)
Physician Discharge Summary   Patient ID: Sara Bowman MRN: [id number] DOB/AGE: 1923/02/22 84 y.o.  Admit date: 03/19/2017 Discharge date: 03/22/2017  Primary Care Physician:  Sinda Du, MD   Recommendations for Outpatient Follow-up:  1. Follow up with PCP in 1-2 weeks 2. Please obtain BMP/CBC in one week 3. Please hold aspirin while patient is on DVT prophylaxis with Lovenox 4. Orthopedics recommendations per Dr Mardelle Matte:   Weightbearing: WBAT LLE Insicional and dressing care: Dressings left intact until follow-up Orthopedic device(s): None Showering: keep wounds dry with press and seal saran wrap VTE prophylaxis: Lovenox 40mg  qd for 1 month Pain control: tylenol Follow - up plan: 2 weeks   Home Health: Patient will be discharged to skilled nursing facility for rehabilitation Equipment/Devices: None  Discharge Condition: stable CODE STATUS: FULL  Diet recommendation: Regular diet   Discharge Diagnoses:    . Closed intertrochanteric fracture of left hip ([place]) . Dementia with behavioral disturbance . Hyponatremia . Malnutrition of moderate degree ([place]) . Physical deconditioning . Advanced Dementia . Gait instability   Consults: Orthopedics    Allergies:  No Known Allergies   DISCHARGE MEDICATIONS: Allergies as of 03/22/2017   No Known Allergies     Medication List    STOP taking these medications   aspirin 81 MG EC tablet     TAKE these medications   acetaminophen 325 MG tablet Commonly known as:  TYLENOL Take 2 tablets (650 mg total) by mouth every 6 (six) hours as needed.   acidophilus Caps capsule Take 2 capsules by mouth daily. What changed:  how much to take   ALPRAZolam 0.25 MG tablet Commonly known as:  XANAX Take 1 tablet (0.25 mg total) by mouth 2 (two) times daily as needed for anxiety.   amLODipine 5 MG tablet Commonly known as:  NORVASC Take 1 tablet (5 mg total) by mouth daily. Start taking on:  03/24/2017 What changed:   These instructions start on 03/24/2017. If you are unsure what to do until then, ask your doctor or other care provider.   docusate sodium 100 MG capsule Commonly known as:  COLACE Take 1 capsule (100 mg total) by mouth 2 (two) times daily.   enoxaparin 40 MG/0.4ML injection Commonly known as:  LOVENOX Inject 0.4 mLs (40 mg total) into the skin daily.   feeding supplement (ENSURE ENLIVE) Liqd Take 237 mLs by mouth 2 (two) times daily between meals.   lisinopril 20 MG tablet Commonly known as:  PRINIVIL,ZESTRIL Take 1 tablet (20 mg total) by mouth daily. HOLD UNTIL PCP/MD EVALUATION What changed:  additional instructions   polyethylene glycol packet Commonly known as:  MIRALAX / GLYCOLAX Take 17 g by mouth daily as needed for mild constipation.   sennosides-docusate sodium 8.6-50 MG tablet Commonly known as:  SENOKOT-S Take 2 tablets by mouth daily.            Discharge Care Instructions  (From admission, onward)        Start     Ordered   03/19/17 0000  Weight bearing as tolerated     03/19/17 1908       Brief H and P: For complete details please refer to admission H and P, but in brief patient is a 84 year old female with advanced dementia, hypertension presented to [facility] ED via EMS after a witnessed fall at home.According to records, the patient was seen falling down in yard at home while walking in the grass. She fell and landed on her left  hip and was unable to get up off the ground after the fall.  She did complain of pain. There was no syncope and daughter denies that the patient hit her head.   Left hip x-ray was positive for acute comminuted and mildly displaced left intertrochanteric fracture. Patient was mildly hyponatremic with sodium of 131. She was transferred to [facility] for surgery.    Hospital Course:  Closed intertrochanteric fracture of left hip (HCC) -Presented with witnessed fall at home, left hip intertrochanteric  fracture. -Orthopedics was consulted, patient underwent OR on 2/26 with intramedullary nail, postop day # 3 - Patient was placed on DVT prophylaxis with Lovenox by orthopedics. Hold aspirin while patient is on Lovenox. -  patient was noticed to be too somnolent with narcotics hence discontinued. Patient will continue Tylenol for pain control  -  hemoglobin slowly trended down, 7.7 on 3/1 received 1 unit of packed RBC transfusion prior to discharge, Hb 10.1 after the transfusion.     Physical deconditioning - PTOT evaluation recommended skilled nursing therapy for rehabilitation    Hyponatremia - Improved, likely from mild dehydration , patient presented with sodium of 130, improved 132 at the time of discharge.    Hypertension - BP stable.  -  loosen up a little, Norvasc was held. Patient may restart Norvasc in 2 days.  - start lisinopril after patient has been evaluated by her primary care physician or skilled nursing facility M.D.      Dementia with behavioral disturbance - Discontinue narcotics, better sleep hygiene, PT OT    Malnutrition of moderate degree (HCC) -Nutrition consult   Day of Discharge S: Much more alert and awake today, pain controlled  BP 138/67   Pulse 94   Temp 98.3 F (36.8 C) (Oral)   Resp 16   Ht 5\' 2"  (1.575 m)   Wt 50.8 kg (112 lb)   SpO2 100%   BMI 20.49 kg/m   Physical Exam: General: Alert and awake oriented xself, not in any acute distress. HEENT: anicteric sclera, pupils reactive to light and accommodation CVS: S1-S2 clear no murmur rubs or gallops Chest: clear to auscultation bilaterally, no wheezing rales or rhonchi Abdomen: soft nontender, nondistended, normal bowel sounds Extremities: no cyanosis, clubbing or edema noted bilaterally    The results of significant diagnostics from this hospitalization (including imaging, microbiology, ancillary and laboratory) are listed below for reference.      Procedures/Studies:  Dg  Chest 1 View  Result Date: 03/19/2017 CLINICAL DATA:  Left hip fracture. EXAM: CHEST 1 VIEW COMPARISON:  10/12/2014 FINDINGS: Mild cardiac enlargement. Aortic atherosclerosis noted. Diffuse coarsened interstitial markings are identified throughout both lungs. No superimposed pleural effusion, pulmonary edema or airspace consolidation. IMPRESSION: 1. Chronic interstitial coarsening without superimposed acute abnormality. 2.  Aortic Atherosclerosis (ICD10-I70.0). 3. Mild cardiac enlargement. Electronically Signed   By: Kerby Moors M.D.   On: 03/19/2017 09:28   Pelvis Portable  Result Date: 03/19/2017 CLINICAL DATA:  Status post ORIF of fracture EXAM: PORTABLE PELVIS 1-2 VIEWS COMPARISON:  03/19/2017 FINDINGS: Prior intramedullary rod and screw fixation of right femur. Pubic symphysis and rami are intact. Interval intramedullary rod fixation of the left femur for intertrochanteric fracture. Small amount of soft tissue gas lateral hip and proximal thigh. Vascular calcifications. IMPRESSION: Intramedullary rod fixation of comminuted left intertrochanteric fracture with expected postsurgical changes Electronically Signed   By: Donavan Foil M.D.   On: 03/19/2017 20:23   Dg Hip Unilat W Or Wo Pelvis 2-3 Views Left  Result Date: 03/19/2017 CLINICAL DATA:  Left hip pain.  Status post fall. EXAM: DG HIP (WITH OR WITHOUT PELVIS) 2-3V LEFT COMPARISON:  None. FINDINGS: Generalized osteopenia. Comminuted and mildly displaced left intertrochanteric fracture. No hip dislocation. Prior right intertrochanteric ORIF with a healed intertrochanteric fracture. Subchondral sclerosis in the right femoral head most concerning for avascular necrosis. No aggressive osseous lesion. IMPRESSION: 1. Acute comminuted and mildly displaced left intertrochanteric fracture. Electronically Signed   By: Kathreen Devoid   On: 03/19/2017 09:28   Dg Femur Min 2 Views Left  Result Date: 03/19/2017 CLINICAL DATA:  Internal fixation EXAM: LEFT  FEMUR 2 VIEWS COMPARISON:  03/19/2017 FINDINGS: Placement of intramedullary nail and dynamic hip screw across the left femoral intertrochanteric fracture. Anatomic alignment. No hardware complicating feature. IMPRESSION: Internal fixation.  Anatomic alignment without complicating feature. Electronically Signed   By: Rolm Baptise M.D.   On: 03/19/2017 20:20      LAB RESULTS: Basic Metabolic Panel: Recent Labs  Lab 03/21/17 0700 03/22/17 0645  NA 134* 132*  K 3.7 3.8  CL 100* 97*  CO2 26 28  GLUCOSE 115* 111*  BUN 10 10  CREATININE 0.59 0.59  CALCIUM 8.7* 8.5*   Liver Function Tests: Recent Labs  Lab 03/21/17 0700 03/22/17 0645  AST 21 22  ALT 13* 12*  ALKPHOS 46 44  BILITOT 0.7 0.7  PROT 5.1* 4.9*  ALBUMIN 2.9* 2.6*   No results for input(s): LIPASE, AMYLASE in the last 168 hours. No results for input(s): AMMONIA in the last 168 hours. CBC: Recent Labs  Lab 03/21/17 0700 03/22/17 0645 03/22/17 1447  WBC 9.2 7.3  --   NEUTROABS 6.2 5.0  --   HGB 8.3* 7.7* 10.1*  HCT 25.2* 22.2* 29.6*  MCV 89.0 89.5  --   PLT 137* 143*  --    Cardiac Enzymes: No results for input(s): CKTOTAL, CKMB, CKMBINDEX, TROPONINI in the last 168 hours. BNP: Invalid input(s): POCBNP CBG: No results for input(s): GLUCAP in the last 168 hours.    Disposition and Follow-up: Discharge Instructions    Diet - low sodium heart healthy   Complete by:  As directed    Increase activity slowly   Complete by:  As directed    Weight bearing as tolerated   Complete by:  As directed        DISPOSITION:SNF   Startup    Marchia Bond, MD. Schedule an appointment as soon as possible for a visit in 2 week(s).   Specialty:  Orthopedic Surgery Contact information: [address] [place] [address] [phone]        Sinda Du, MD. Schedule an appointment as soon as possible for a visit in 2 week(s).   Specialty:  Pulmonary  Disease Contact information: [place] [facility] [address] [phone]            Time coordinating discharge:  64mins   Signed:   Estill Cotta M.D. [facility] 03/22/2017, 4:20 PM Pager: [phone]

## 2017-03-22 NOTE — Progress Notes (Signed)
Report called to [facility] at [place] at this time

## 2017-03-23 LAB — TYPE AND SCREEN
ABO/RH(D): O POS
Antibody Screen: NEGATIVE
UNIT DIVISION: 0

## 2017-03-23 LAB — BPAM RBC
Blood Product Expiration Date: 201903032359
ISSUE DATE / TIME: 201903011154
UNIT TYPE AND RH: 9500

## 2017-04-04 ENCOUNTER — Other Ambulatory Visit: Payer: Self-pay

## 2017-04-04 ENCOUNTER — Encounter (HOSPITAL_COMMUNITY): Payer: Self-pay | Admitting: Emergency Medicine

## 2017-04-04 ENCOUNTER — Emergency Department (HOSPITAL_COMMUNITY)
Admission: EM | Admit: 2017-04-04 | Discharge: 2017-04-04 | Disposition: A | Discharge status: Home / Self Care | Payer: Medicare Other | Attending: Emergency Medicine | Admitting: Emergency Medicine

## 2017-04-04 ENCOUNTER — Emergency Department (HOSPITAL_COMMUNITY): Payer: Medicare Other

## 2017-04-04 DIAGNOSIS — Y92129 Unspecified place in nursing home as the place of occurrence of the external cause: Secondary | ICD-10-CM | POA: Insufficient documentation

## 2017-04-04 DIAGNOSIS — Z853 Personal history of malignant neoplasm of breast: Secondary | ICD-10-CM | POA: Diagnosis not present

## 2017-04-04 DIAGNOSIS — I10 Essential (primary) hypertension: Secondary | ICD-10-CM | POA: Diagnosis not present

## 2017-04-04 DIAGNOSIS — W06XXXA Fall from bed, initial encounter: Secondary | ICD-10-CM | POA: Insufficient documentation

## 2017-04-04 DIAGNOSIS — F0390 Unspecified dementia without behavioral disturbance: Secondary | ICD-10-CM | POA: Diagnosis not present

## 2017-04-04 DIAGNOSIS — Y999 Unspecified external cause status: Secondary | ICD-10-CM | POA: Diagnosis not present

## 2017-04-04 DIAGNOSIS — S0003XA Contusion of scalp, initial encounter: Secondary | ICD-10-CM

## 2017-04-04 DIAGNOSIS — Y939 Activity, unspecified: Secondary | ICD-10-CM | POA: Diagnosis not present

## 2017-04-04 DIAGNOSIS — E876 Hypokalemia: Secondary | ICD-10-CM | POA: Diagnosis not present

## 2017-04-04 DIAGNOSIS — S0990XA Unspecified injury of head, initial encounter: Secondary | ICD-10-CM | POA: Diagnosis present

## 2017-04-04 DIAGNOSIS — N390 Urinary tract infection, site not specified: Secondary | ICD-10-CM | POA: Diagnosis not present

## 2017-04-04 LAB — CBC WITH DIFFERENTIAL/PLATELET
Basophils Absolute: 0 K/uL (ref 0.0–0.1)
Basophils Relative: 0 %
Eosinophils Absolute: 0 K/uL (ref 0.0–0.7)
Eosinophils Relative: 0 %
HCT: 33.8 % — ABNORMAL LOW (ref 36.0–46.0)
Hemoglobin: 11.1 g/dL — ABNORMAL LOW (ref 12.0–15.0)
Lymphocytes Relative: 17 %
Lymphs Abs: 1.1 K/uL (ref 0.7–4.0)
MCH: 29.8 pg (ref 26.0–34.0)
MCHC: 32.8 g/dL (ref 30.0–36.0)
MCV: 90.6 fL (ref 78.0–100.0)
Monocytes Absolute: 0.6 K/uL (ref 0.1–1.0)
Monocytes Relative: 8 %
Neutro Abs: 4.9 K/uL (ref 1.7–7.7)
Neutrophils Relative %: 75 %
Platelets: 402 K/uL — ABNORMAL HIGH (ref 150–400)
RBC: 3.73 MIL/uL — ABNORMAL LOW (ref 3.87–5.11)
RDW: 13.7 % (ref 11.5–15.5)
WBC: 6.6 K/uL (ref 4.0–10.5)

## 2017-04-04 LAB — URINALYSIS, ROUTINE W REFLEX MICROSCOPIC
Bilirubin Urine: NEGATIVE
Glucose, UA: NEGATIVE mg/dL
Ketones, ur: NEGATIVE mg/dL
Nitrite: NEGATIVE
Protein, ur: NEGATIVE mg/dL
Specific Gravity, Urine: 1.006 (ref 1.005–1.030)
pH: 7 (ref 5.0–8.0)

## 2017-04-04 LAB — BASIC METABOLIC PANEL
ANION GAP: 11 (ref 5–15)
BUN: 12 mg/dL (ref 6–20)
CO2: 26 mmol/L (ref 22–32)
Calcium: 8.9 mg/dL (ref 8.9–10.3)
Chloride: 91 mmol/L — ABNORMAL LOW (ref 101–111)
Creatinine, Ser: 0.63 mg/dL (ref 0.44–1.00)
GFR calc Af Amer: >60 mL/min (ref >60)
Glucose, Bld: 109 mg/dL — ABNORMAL HIGH (ref 65–99)
POTASSIUM: 2.9 mmol/L — AB (ref 3.5–5.1)
SODIUM: 128 mmol/L — AB (ref 135–145)

## 2017-04-04 MED ORDER — CEPHALEXIN 500 MG PO CAPS: 500.0000 mg | ORAL_CAPSULE | Freq: Two times a day (BID) | ORAL | 0 refills | Status: AC

## 2017-04-04 MED ORDER — POTASSIUM CHLORIDE ER 10 MEQ PO TBCR: 10.0000 meq | EXTENDED_RELEASE_TABLET | Freq: Every day | ORAL | 0 refills | Status: AC

## 2017-04-04 MED ORDER — CEPHALEXIN 500 MG PO CAPS
500.0000 mg | ORAL_CAPSULE | Freq: Once | ORAL | Status: AC
  Administered 2017-04-04: 500 mg via ORAL
  Filled 2017-04-04: qty 1

## 2017-04-04 MED ORDER — POTASSIUM CHLORIDE CRYS ER 20 MEQ PO TBCR
40.0000 meq | EXTENDED_RELEASE_TABLET | Freq: Once | ORAL | Status: AC
  Administered 2017-04-04: 40 meq via ORAL
  Filled 2017-04-04: qty 2

## 2017-04-04 NOTE — ED Notes (Signed)
Pt to CT

## 2017-04-04 NOTE — Discharge Instructions (Signed)
Your testing has shown no signs of brain injury or spinal fracture Your blood work shows that you have a low potassium and a slightly low sodium We have given you a potassium supplement and you should continue to take this for the next several days Please add extra salt your food for the next several days Please have your family doctor recheck your metabolic panel blood test within 1 week You also have a urinary tract infection, take Keflex 500 mg by mouth twice a day for 7 days Seek medical attention for severe or worsening fever vomiting pain or difficulty breathing or weakness Please apply ice packs to the hematoma for no more than 20 minutes at a time several times a day.

## 2017-04-04 NOTE — ED Provider Notes (Signed)
[facility] EMERGENCY DEPARTMENT Provider Note   CSN: [id number] Arrival date & time: 04/04/17  1825     History   Chief Complaint Chief Complaint  Patient presents with  . Fall    HPI Sara Bowman is a 84 y.o. female.  HPI  The pt is a 84 y/o female with dementia - she recently was admitted to the hospital for a fall that resulted in a hip fracture and required surgery on 03/19/17 by Dr. Mardelle Matte.  She has been in a SNF and has this evening been found to have fallen out of bed and injured her head.  She has been on Lovenox injections for the femur fracture since d/c - she has indwelling foley - Hematoma was noted to the R side of the head.  The pt is unable to give any valuable information - level 5 caveat exists secondary to dementia.  Past Medical History:  Diagnosis Date  . Cancer ([place])    breast-left  . Confusion   . Dementia   . Hypertension     Patient Active Problem List   Diagnosis Date Noted  . Closed intertrochanteric fracture of left hip ([place]) 03/19/2017  . Dementia 03/19/2017  . Gait instability 03/19/2017  . Acute blood loss anemia 10/15/2014  . Hip fracture ([place]) 10/12/2014  . Malnutrition of moderate degree ([place]) 08/19/2014  . Altered mental status 08/18/2014  . Elevated troponin 08/17/2014  . Altered mental state 08/17/2014  . Physical deconditioning 08/17/2014  . Hyponatremia 08/17/2014  . Hypertension 08/17/2014  . Dementia with behavioral disturbance 08/17/2014    Past Surgical History:  Procedure Laterality Date  . ABDOMINAL HYSTERECTOMY    . BLADDER SURGERY     90's  problems with surgery , had procedure undone in [place] 4 days later  . BREAST LUMPECTOMY N/A 1995  . INTRAMEDULLARY (IM) NAIL INTERTROCHANTERIC Right 10/13/2014   Procedure: INTRAMEDULLARY (IM) NAIL INTERTROCHANTRIC RIGHT HIP;  Surgeon: Carole Civil, MD;  Location: [facility];  Service: Orthopedics;  Laterality: Right;  . INTRAMEDULLARY (IM) NAIL INTERTROCHANTERIC Left  03/19/2017   Procedure: INTRAMEDULLARY (IM) NAIL INTERTROCHANTRIC;  Surgeon: Marchia Bond, MD;  Location: [facility];  Service: Orthopedics;  Laterality: Left;    OB History    No data available       Home Medications    Prior to Admission medications   Medication Sig Start Date End Date Taking? Authorizing Provider  acetaminophen (TYLENOL) 325 MG tablet Take 2 tablets (650 mg total) by mouth every 6 (six) hours as needed. 03/19/17   Marchia Bond, MD  acidophilus (RISAQUAD) CAPS capsule Take 2 capsules by mouth daily. Patient taking differently: Take 1 capsule by mouth daily.  10/15/14   Sinda Du, MD  ALPRAZolam Duanne Moron) 0.25 MG tablet Take 1 tablet (0.25 mg total) by mouth 2 (two) times daily as needed for anxiety. 03/22/17   Rai, Ripudeep K, MD  amLODipine (NORVASC) 5 MG tablet Take 1 tablet (5 mg total) by mouth daily. 03/24/17   Rai, Vernelle Emerald, MD  cephALEXin (KEFLEX) 500 MG capsule Take 1 capsule (500 mg total) by mouth 2 (two) times daily. 04/04/17   Noemi Chapel, MD  docusate sodium (COLACE) 100 MG capsule Take 1 capsule (100 mg total) by mouth 2 (two) times daily. 03/22/17   Rai, Ripudeep K, MD  enoxaparin (LOVENOX) 40 MG/0.4ML injection Inject 0.4 mLs (40 mg total) into the skin daily. 03/19/17   Marchia Bond, MD  feeding supplement, ENSURE ENLIVE, (ENSURE ENLIVE) LIQD Take  237 mLs by mouth 2 (two) times daily between meals. 08/19/14   Sinda Du, MD  lisinopril (PRINIVIL,ZESTRIL) 20 MG tablet Take 1 tablet (20 mg total) by mouth daily. HOLD UNTIL PCP/MD EVALUATION 03/22/17   Rai, Vernelle Emerald, MD  polyethylene glycol (MIRALAX / GLYCOLAX) packet Take 17 g by mouth daily as needed for mild constipation. 03/22/17   Rai, Ripudeep K, MD  potassium chloride (K-DUR) 10 MEQ tablet Take 1 tablet (10 mEq total) by mouth daily for 7 days. 04/04/17 04/11/17  Noemi Chapel, MD  sennosides-docusate sodium (SENOKOT-S) 8.6-50 MG tablet Take 2 tablets by mouth daily. 03/19/17   Marchia Bond, MD     Family History Family History  Problem Relation Age of Onset  . Aneurysm Mother     Social History Social History   Tobacco Use  . Smoking status: Never Smoker  . Smokeless tobacco: Never Used  Substance Use Topics  . Alcohol use: No  . Drug use: No     Allergies   Patient has no known allergies.   Review of Systems Review of Systems  Unable to perform ROS: Dementia     Physical Exam Updated Vital Signs BP (!) 153/73 (BP Location: Left Arm)   Pulse 94   Temp 98.6 F (37 C) (Oral)   Resp 15   Ht 5' (1.524 m)   Wt 50.8 kg (112 lb)   SpO2 98%   BMI 21.87 kg/m   Physical Exam  Constitutional: She appears well-developed and well-nourished. No distress.  HENT:  Head: Normocephalic.  Mouth/Throat: Oropharynx is clear and moist. No oropharyngeal exudate.  Hematoma to the R side of the head - there is no maloclussion or facial injury  Eyes: Conjunctivae and EOM are normal. Pupils are equal, round, and reactive to light. Right eye exhibits no discharge. Left eye exhibits no discharge. No scleral icterus.  Neck: No JVD present. No thyromegaly present.  Immobilized in C collar  Cardiovascular: Normal rate, regular rhythm and intact distal pulses. Exam reveals no gallop and no friction rub.  Murmur (loud murmur - systolic) heard. Pulmonary/Chest: Effort normal and breath sounds normal. No respiratory distress. She has no wheezes. She has no rales.  Abdominal: Soft. Bowel sounds are normal. She exhibits no distension and no mass. There is no tenderness.  Musculoskeletal: Normal range of motion. She exhibits no edema or tenderness.  No deformity of the arms or legs - able to move all 4 ext with normal ROM and normal joints - no ttp in the compartments.  Lymphadenopathy:    She has no cervical adenopathy.  Neurological: She is alert. Coordination normal.  Follows commands - pleasantly demented  Skin: Skin is warm and dry. No rash noted. No erythema.  Psychiatric:  She has a normal mood and affect. Her behavior is normal.  Nursing note and vitals reviewed.    ED Treatments / Results  Labs (all labs ordered are listed, but only abnormal results are displayed) Labs Reviewed  CBC WITH DIFFERENTIAL/PLATELET - Abnormal; Notable for the following components:      Result Value   RBC 3.73 (*)    Hemoglobin 11.1 (*)    HCT 33.8 (*)    Platelets 402 (*)    All other components within normal limits  BASIC METABOLIC PANEL - Abnormal; Notable for the following components:   Sodium 128 (*)    Potassium 2.9 (*)    Chloride 91 (*)    Glucose, Bld 109 (*)    All other  components within normal limits  URINALYSIS, ROUTINE W REFLEX MICROSCOPIC - Abnormal; Notable for the following components:   Hgb urine dipstick MODERATE (*)    Leukocytes, UA LARGE (*)    Bacteria, UA RARE (*)    Squamous Epithelial / LPF 0-5 (*)    All other components within normal limits  URINE CULTURE     Radiology Ct Head Wo Contrast  Result Date: 04/04/2017 CLINICAL DATA:  Unwitnessed fall. EXAM: CT HEAD WITHOUT CONTRAST CT CERVICAL SPINE WITHOUT CONTRAST TECHNIQUE: Multidetector CT imaging of the head and cervical spine was performed following the standard protocol without intravenous contrast. Multiplanar CT image reconstructions of the cervical spine were also generated. COMPARISON:  Head CT 01/03/2017. FINDINGS: CT HEAD FINDINGS Brain: There is no evidence for acute hemorrhage, hydrocephalus, mass lesion, or abnormal extra-axial fluid collection. No definite CT evidence for acute infarction. Diffuse loss of parenchymal volume is consistent with atrophy. Patchy low attenuation in the deep hemispheric and periventricular white matter is nonspecific, but likely reflects chronic microvascular ischemic demyelination. Vascular: Atherosclerotic calcification of the carotid siphons noted at the skull base. No dense MCA sign. Skull: No evidence for fracture. No worrisome lytic or sclerotic  lesion. Sinuses/Orbits: The visualized paranasal sinuses and right mastoid air cells are clear. Stable appearance of fluid in the left mastoid air cells. Visualized portions of the globes and intraorbital fat are unremarkable. Other: Prominent right frontotemporal scalp contusion. CT CERVICAL SPINE FINDINGS Alignment: Straightening of normal cervical lordosis. Skull base and vertebrae: No acute fracture. No primary bone lesion or focal pathologic process. Soft tissues and spinal canal: No prevertebral fluid or swelling. No visible canal hematoma. Disc levels: Near complete loss of disc height is seen at C5-6. Advanced degenerative changes also noted at C4-5 and C6-7. C7-T1 facets are fused bilaterally. Upper chest: Biapical pleuroparenchymal scarring noted in the lungs. Other: None. IMPRESSION: 1. Stable head CT. Atrophy with chronic small vessel white matter ischemic disease. No acute findings. 2. Stable left mastoid effusion. 3. Degenerative changes in the cervical spine without fracture. 4. Loss of cervical lordosis. This can be related to patient positioning, muscle spasm or soft tissue injury. Electronically Signed   By: Misty Stanley M.D.   On: 04/04/2017 20:31   Ct Cervical Spine Wo Contrast  Result Date: 04/04/2017 CLINICAL DATA:  Unwitnessed fall. EXAM: CT HEAD WITHOUT CONTRAST CT CERVICAL SPINE WITHOUT CONTRAST TECHNIQUE: Multidetector CT imaging of the head and cervical spine was performed following the standard protocol without intravenous contrast. Multiplanar CT image reconstructions of the cervical spine were also generated. COMPARISON:  Head CT 01/03/2017. FINDINGS: CT HEAD FINDINGS Brain: There is no evidence for acute hemorrhage, hydrocephalus, mass lesion, or abnormal extra-axial fluid collection. No definite CT evidence for acute infarction. Diffuse loss of parenchymal volume is consistent with atrophy. Patchy low attenuation in the deep hemispheric and periventricular white matter is  nonspecific, but likely reflects chronic microvascular ischemic demyelination. Vascular: Atherosclerotic calcification of the carotid siphons noted at the skull base. No dense MCA sign. Skull: No evidence for fracture. No worrisome lytic or sclerotic lesion. Sinuses/Orbits: The visualized paranasal sinuses and right mastoid air cells are clear. Stable appearance of fluid in the left mastoid air cells. Visualized portions of the globes and intraorbital fat are unremarkable. Other: Prominent right frontotemporal scalp contusion. CT CERVICAL SPINE FINDINGS Alignment: Straightening of normal cervical lordosis. Skull base and vertebrae: No acute fracture. No primary bone lesion or focal pathologic process. Soft tissues and spinal canal: No prevertebral fluid or  swelling. No visible canal hematoma. Disc levels: Near complete loss of disc height is seen at C5-6. Advanced degenerative changes also noted at C4-5 and C6-7. C7-T1 facets are fused bilaterally. Upper chest: Biapical pleuroparenchymal scarring noted in the lungs. Other: None. IMPRESSION: 1. Stable head CT. Atrophy with chronic small vessel white matter ischemic disease. No acute findings. 2. Stable left mastoid effusion. 3. Degenerative changes in the cervical spine without fracture. 4. Loss of cervical lordosis. This can be related to patient positioning, muscle spasm or soft tissue injury. Electronically Signed   By: Misty Stanley M.D.   On: 04/04/2017 20:31    Procedures Procedures (including critical care time)  Medications Ordered in ED Medications  cephALEXin (KEFLEX) capsule 500 mg (not administered)  potassium chloride SA (K-DUR,KLOR-CON) CR tablet 40 mEq (not administered)     Initial Impression / Assessment and Plan / ED Course  I have reviewed the triage vital signs and the nursing notes.  Pertinent labs & imaging results that were available during my care of the patient were reviewed by me and considered in my medical decision making  (see chart for details).  Clinical Course as of Apr 04 2125  Thu Apr 04, 2017  2003 CT scan neg for acute fractures Neg for ICH Ice pack placed on head  [BM]  2101 WBC: 6.6 [BM]  2102 Hemoglobin: (!) 11.1 [BM]  2102 Platelets: (!) 402 [BM]  2102 Sodium: (!) 128 [BM]  2102 The patient has mild hyponatremia and hypokalemia, metabolic panel otherwise unremarkable, blood counts with mild anemia but normal white blood cell count.  CT scans reviewed, reassuring Potassium: (!) 2.9 [BM]  2102 Cervical collar removed, urinalysis pending, Foley catheter changed  [BM]  2123 WBC, UA: TOO NUMEROUS TO COUNT [BM]  2123 Leukocytes, UA: (!) LARGE [BM]  2123 Urinalysis with a catheter present show some signs of infection.  The patient does not appear toxic but likely would benefit from an antibiotic, plan on Keflex twice a day for 5 days.  Culture sent Bacteria, UA: (!) RARE [BM]    Clinical Course User Index [BM] Noemi Chapel, MD    After long discussion with family and education about the potential for decompensation and the poor surgical candidacy, decision was made to further evaluate with CT scan for informational purposes and for planning on discharge however they both agree and the daughter states she would not survive surgery we would not want that.  She has had a urinary catheter in for the last 3 weeks, this will be rechecked and a Foley catheter will be replaced as it does appear dirty.  Additionally basic labs will be checked to make sure no electrolyte or renal abnormalities.  The patient is in no distress and though she is hard of hearing she is very kind and compliant.  Final Clinical Impressions(s) / ED Diagnoses   Final diagnoses:  Hematoma of scalp, initial encounter  Hypokalemia  Urinary tract infection without hematuria, site unspecified    ED Discharge Orders        Ordered    cephALEXin (KEFLEX) 500 MG capsule  2 times daily     04/04/17 2125    potassium chloride (K-DUR) 10  MEQ tablet  Daily     04/04/17 2127       Noemi Chapel, MD 04/04/17 2127

## 2017-04-04 NOTE — ED Triage Notes (Addendum)
PT from [facility] (SNF) and was reported to EMS unwitnessed fall out of bed this evening. PT c/o headache upon arrival. PT on Lovenox injections for s/p left femur fx and has indwelling foley catheter. Hematoma noted to right side of head.

## 2017-04-04 NOTE — ED Notes (Signed)
Waiting for NT to assist with Foley

## 2017-04-07 LAB — URINE CULTURE: Culture: >=100000 — AB

## 2017-04-08 ENCOUNTER — Telehealth: Payer: Self-pay | Admitting: Emergency Medicine

## 2017-04-08 NOTE — Telephone Encounter (Signed)
Post ED Visit - Positive Culture Follow-up  Culture report reviewed by antimicrobial stewardship pharmacist:  []  Elenor Quinones, Pharm.D. []  Heide Guile, Pharm.D., BCPS AQ-ID []  Parks Neptune, Pharm.D., BCPS [x]  Alycia Rossetti, Pharm.D., BCPS []  [place], Pharm.D., BCPS, AAHIVP []  Legrand Como, Pharm.D., BCPS, AAHIVP []  Salome Arnt, PharmD, BCPS []  Jalene Mullet, PharmD []  Vincenza Hews, PharmD, BCPS  Positive urine culture Treated with cephalexin, organism sensitive to the same and no further patient follow-up is required at this time.  Hazle Nordmann 04/08/2017, 12:07 PM

## 2017-05-21 ENCOUNTER — Encounter (HOSPITAL_COMMUNITY): Payer: Self-pay | Admitting: Emergency Medicine

## 2017-05-21 ENCOUNTER — Other Ambulatory Visit: Payer: Self-pay

## 2017-05-21 ENCOUNTER — Emergency Department (HOSPITAL_COMMUNITY)
Admission: EM | Admit: 2017-05-21 | Discharge: 2017-05-21 | Disposition: A | Discharge status: Home / Self Care | Payer: Medicare Other | Attending: Emergency Medicine | Admitting: Emergency Medicine

## 2017-05-21 ENCOUNTER — Emergency Department (HOSPITAL_COMMUNITY): Payer: Medicare Other

## 2017-05-21 DIAGNOSIS — Z853 Personal history of malignant neoplasm of breast: Secondary | ICD-10-CM | POA: Insufficient documentation

## 2017-05-21 DIAGNOSIS — I10 Essential (primary) hypertension: Secondary | ICD-10-CM | POA: Diagnosis not present

## 2017-05-21 DIAGNOSIS — E871 Hypo-osmolality and hyponatremia: Secondary | ICD-10-CM | POA: Insufficient documentation

## 2017-05-21 DIAGNOSIS — R2689 Other abnormalities of gait and mobility: Secondary | ICD-10-CM | POA: Diagnosis present

## 2017-05-21 DIAGNOSIS — W19XXXA Unspecified fall, initial encounter: Secondary | ICD-10-CM

## 2017-05-21 DIAGNOSIS — F0390 Unspecified dementia without behavioral disturbance: Secondary | ICD-10-CM | POA: Diagnosis not present

## 2017-05-21 DIAGNOSIS — Z79899 Other long term (current) drug therapy: Secondary | ICD-10-CM | POA: Insufficient documentation

## 2017-05-21 DIAGNOSIS — Z9181 History of falling: Secondary | ICD-10-CM | POA: Diagnosis not present

## 2017-05-21 DIAGNOSIS — E876 Hypokalemia: Secondary | ICD-10-CM

## 2017-05-21 LAB — CBC
HCT: 34.2 % — ABNORMAL LOW (ref 36.0–46.0)
HEMOGLOBIN: 11.1 g/dL — AB (ref 12.0–15.0)
MCH: 28.5 pg (ref 26.0–34.0)
MCHC: 32.5 g/dL (ref 30.0–36.0)
MCV: 87.7 fL (ref 78.0–100.0)
PLATELETS: 233 10*3/uL (ref 150–400)
RBC: 3.9 MIL/uL (ref 3.87–5.11)
RDW: 14.3 % (ref 11.5–15.5)
WBC: 5.6 10*3/uL (ref 4.0–10.5)

## 2017-05-21 LAB — BASIC METABOLIC PANEL
Anion gap: 13 (ref 5–15)
BUN: 7 mg/dL (ref 6–20)
CHLORIDE: 90 mmol/L — AB (ref 101–111)
CO2: 27 mmol/L (ref 22–32)
Calcium: 9.5 mg/dL (ref 8.9–10.3)
Creatinine, Ser: 0.61 mg/dL (ref 0.44–1.00)
GFR calc Af Amer: >60 mL/min (ref >60)
GFR calc non Af Amer: >60 mL/min (ref >60)
GLUCOSE: 102 mg/dL — AB (ref 65–99)
POTASSIUM: 3 mmol/L — AB (ref 3.5–5.1)
Sodium: 130 mmol/L — ABNORMAL LOW (ref 135–145)

## 2017-05-21 MED ORDER — POTASSIUM CHLORIDE CRYS ER 20 MEQ PO TBCR
40.0000 meq | EXTENDED_RELEASE_TABLET | Freq: Once | ORAL | Status: AC
  Administered 2017-05-21: 40 meq via ORAL
  Filled 2017-05-21: qty 2

## 2017-05-21 NOTE — ED Notes (Signed)
[facility] at bedside for transport

## 2017-05-21 NOTE — ED Notes (Signed)
Pt taken to CT.

## 2017-05-21 NOTE — ED Provider Notes (Signed)
[facility] EMERGENCY DEPARTMENT Provider Note   CSN: [id number] Arrival date & time: 05/21/17  1732     History   Chief Complaint Chief Complaint  Patient presents with  . Fall    HPI Sara Bowman is a 84 y.o. female.  HPI  The patient is a 84 year old female, she has a known history of dementia, she has had breast cancer, hypertension and a recent hip fracture that has left her in a rehabilitation facility for the last 2 months.  She is accompanied today by both the paramedics as well as a close friend who sits with her during the day.  The patient was living independently however she had 24/7 sitting including the friend or her daughter who gave her 24-hour around-the-clock care.  The patient is known to follow regularly with her dementia, there was reports of approximately 4 falls in the last 24 hours at the nursing facility which were each unwitnessed by nursing staff.  The patient did not have any changes in her mental status and evidently was sent over as they wanted to have some imaging of her head to make sure she had not had a stroke or hit her head.  According to the friend who is with her at the bedside at this time she is at her normal mental status in appearance.  The patient is severely hard of hearing, demented and is off balance at baseline.  Past Medical History:  Diagnosis Date  . Cancer ([place])    breast-left  . Confusion   . Dementia   . Hypertension     Patient Active Problem List   Diagnosis Date Noted  . Closed intertrochanteric fracture of left hip ([place]) 03/19/2017  . Dementia 03/19/2017  . Gait instability 03/19/2017  . Acute blood loss anemia 10/15/2014  . Hip fracture ([place]) 10/12/2014  . Malnutrition of moderate degree ([place]) 08/19/2014  . Altered mental status 08/18/2014  . Elevated troponin 08/17/2014  . Altered mental state 08/17/2014  . Physical deconditioning 08/17/2014  . Hyponatremia 08/17/2014  . Hypertension 08/17/2014  . Dementia  with behavioral disturbance 08/17/2014    Past Surgical History:  Procedure Laterality Date  . ABDOMINAL HYSTERECTOMY    . BLADDER SURGERY     90's  problems with surgery , had procedure undone in [place] 4 days later  . BREAST LUMPECTOMY N/A 1995  . INTRAMEDULLARY (IM) NAIL INTERTROCHANTERIC Right 10/13/2014   Procedure: INTRAMEDULLARY (IM) NAIL INTERTROCHANTRIC RIGHT HIP;  Surgeon: Carole Civil, MD;  Location: [facility];  Service: Orthopedics;  Laterality: Right;  . INTRAMEDULLARY (IM) NAIL INTERTROCHANTERIC Left 03/19/2017   Procedure: INTRAMEDULLARY (IM) NAIL INTERTROCHANTRIC;  Surgeon: Marchia Bond, MD;  Location: [facility];  Service: Orthopedics;  Laterality: Left;     OB History   None      Home Medications    Prior to Admission medications   Medication Sig Start Date End Date Taking? Authorizing Provider  acetaminophen (TYLENOL) 325 MG tablet Take 2 tablets (650 mg total) by mouth every 6 (six) hours as needed. 03/19/17   Marchia Bond, MD  acidophilus (RISAQUAD) CAPS capsule Take 2 capsules by mouth daily. Patient taking differently: Take 1 capsule by mouth daily.  10/15/14   Sinda Du, MD  ALPRAZolam Duanne Moron) 0.25 MG tablet Take 1 tablet (0.25 mg total) by mouth 2 (two) times daily as needed for anxiety. 03/22/17   Rai, Ripudeep K, MD  amLODipine (NORVASC) 5 MG tablet Take 1 tablet (5 mg total) by mouth daily.  03/24/17   Rai, Vernelle Emerald, MD  cephALEXin (KEFLEX) 500 MG capsule Take 1 capsule (500 mg total) by mouth 2 (two) times daily. 04/04/17   Noemi Chapel, MD  docusate sodium (COLACE) 100 MG capsule Take 1 capsule (100 mg total) by mouth 2 (two) times daily. 03/22/17   Rai, Ripudeep K, MD  enoxaparin (LOVENOX) 40 MG/0.4ML injection Inject 0.4 mLs (40 mg total) into the skin daily. 03/19/17   Marchia Bond, MD  feeding supplement, ENSURE ENLIVE, (ENSURE ENLIVE) LIQD Take 237 mLs by mouth 2 (two) times daily between meals. 08/19/14   Sinda Du, MD  lisinopril  (PRINIVIL,ZESTRIL) 20 MG tablet Take 1 tablet (20 mg total) by mouth daily. HOLD UNTIL PCP/MD EVALUATION 03/22/17   Rai, Vernelle Emerald, MD  polyethylene glycol (MIRALAX / GLYCOLAX) packet Take 17 g by mouth daily as needed for mild constipation. 03/22/17   Rai, Ripudeep K, MD  potassium chloride (K-DUR) 10 MEQ tablet Take 1 tablet (10 mEq total) by mouth daily for 7 days. 04/04/17 04/11/17  Noemi Chapel, MD  sennosides-docusate sodium (SENOKOT-S) 8.6-50 MG tablet Take 2 tablets by mouth daily. 03/19/17   Marchia Bond, MD    Family History Family History  Problem Relation Age of Onset  . Aneurysm Mother     Social History Social History   Tobacco Use  . Smoking status: Never Smoker  . Smokeless tobacco: Never Used  Substance Use Topics  . Alcohol use: No  . Drug use: No     Allergies   Patient has no known allergies.   Review of Systems Review of Systems  Unable to perform ROS: Dementia     Physical Exam Updated Vital Signs BP (!) 164/82 (BP Location: Left Arm)   Pulse 92   Temp 98.1 F (36.7 C) (Oral)   Resp 15   Wt 50.8 kg (112 lb)   SpO2 97%   BMI 21.87 kg/m   Physical Exam  Constitutional: She appears well-developed and well-nourished. No distress.  HENT:  Head: Normocephalic and atraumatic.  Mouth/Throat: Oropharynx is clear and moist. No oropharyngeal exudate.  No hematomas abrasions lacerations contusions hemotympanum malocclusion or raccoon eyes or battle sign.  Eyes: Pupils are equal, round, and reactive to light. Conjunctivae and EOM are normal. Right eye exhibits no discharge. Left eye exhibits no discharge. No scleral icterus.  Neck: Normal range of motion. Neck supple. No JVD present. No thyromegaly present.  Cardiovascular: Normal rate, regular rhythm and intact distal pulses. Exam reveals no gallop and no friction rub.  Murmur ( systolic) heard. Pulmonary/Chest: Effort normal and breath sounds normal. No respiratory distress. She has no wheezes. She has  no rales.  Abdominal: Soft. Bowel sounds are normal. She exhibits no distension and no mass. There is no tenderness.  Musculoskeletal: Normal range of motion. She exhibits no edema or tenderness.  The patient has supple joints and soft compartments diffusely, there is no leg length discrepancies.  The patient is able to lift both legs though she is generally weak, she has equal grips bilaterally with no facial droop  Lymphadenopathy:    She has no cervical adenopathy.  Neurological: She is alert. Coordination normal.  As above  Skin: Skin is warm and dry. No rash noted. No erythema.  No breaks in the skin, slight rash to the legs, nontender, nonerythematous  Psychiatric: She has a normal mood and affect. Her behavior is normal.  Nursing note and vitals reviewed.    ED Treatments / Results  Labs (all  labs ordered are listed, but only abnormal results are displayed) Labs Reviewed  CBC - Abnormal; Notable for the following components:      Result Value   Hemoglobin 11.1 (*)    HCT 34.2 (*)    All other components within normal limits  BASIC METABOLIC PANEL - Abnormal; Notable for the following components:   Sodium 130 (*)    Potassium 3.0 (*)    Chloride 90 (*)    Glucose, Bld 102 (*)    All other components within normal limits    EKG EKG Interpretation  Date/Time:  Tuesday May 21 2017 18:06:21 EDT Ventricular Rate:  97 PR Interval:    QRS Duration: 84 QT Interval:  338 QTC Calculation: 430 R Axis:   49 Text Interpretation:  Sinus rhythm Atrial premature complex Right atrial enlargement Anteroseptal infarct, old since last tracing no significant change Confirmed by Noemi Chapel [phone]) on 05/21/2017 6:10:31 PM   Radiology Ct Head Wo Contrast  Result Date: 05/21/2017 CLINICAL DATA:  Multiple unwitnessed falls. EXAM: CT HEAD WITHOUT CONTRAST TECHNIQUE: Contiguous axial images were obtained from the base of the skull through the vertex without intravenous contrast.  COMPARISON:  CT scan of April 04, 2017. FINDINGS: Brain: Mild diffuse cortical atrophy is noted. Mild chronic ischemic white matter disease is noted. No mass effect or midline shift is noted. Ventricular size is within normal limits. There is no evidence of mass lesion, hemorrhage or acute infarction. Vascular: No hyperdense vessel or unexpected calcification. Skull: Normal. Negative for fracture or focal lesion. Sinuses/Orbits: Left sphenoid sinusitis is noted. Other: None. IMPRESSION: Mild diffuse cortical atrophy. Mild chronic ischemic white matter disease. Left sphenoid sinusitis. No acute intracranial abnormality seen. Electronically Signed   By: Marijo Conception, M.D.   On: 05/21/2017 19:08    Procedures Procedures (including critical care time)  Medications Ordered in ED Medications  potassium chloride SA (K-DUR,KLOR-CON) CR tablet 40 mEq (has no administration in time range)     Initial Impression / Assessment and Plan / ED Course  I have reviewed the triage vital signs and the nursing notes.  Pertinent labs & imaging results that were available during my care of the patient were reviewed by me and considered in my medical decision making (see chart for details).  Clinical Course as of May 21 1916  Tue May 21, 2017  1915 Mild hyponatremia, mild hypokalemia - CBC normal and CT without acute findings Discussed with family - pt stable for d/c back to SNF   [BM]    Clinical Course User Index [BM] Noemi Chapel, MD   Frequent falls, seems to be the patient's baseline, concern from staff at the nursing facility regarding the cause of the falls or potential injuries.  She had lab work and a urinalysis done this morning prior to coming over.  Unfortunately none of this data is available.  We will run some basic labs including electrolytes renal function checking hemoglobin and imaging.  The patient is in no distress and has no obvious injuries  Final Clinical Impressions(s) / ED Diagnoses     Final diagnoses:  Fall, initial encounter  Hyponatremia  Hypokalemia    ED Discharge Orders    None       Noemi Chapel, MD 05/21/17 1918

## 2017-05-21 NOTE — ED Notes (Signed)
Discharge instructions discussed with family, attempt to call report to [facility]/no answer, EMS called for transport at this time

## 2017-05-21 NOTE — ED Triage Notes (Signed)
Pt from [facility] with 4 unwitnessed falls in the past 24 hours. Requesting CT scan.

## 2017-05-21 NOTE — ED Notes (Signed)
Pt alert but confused. No s/o pain.  Pt has fallen x 4 in the last 24 hrs

## 2017-05-21 NOTE — Discharge Instructions (Signed)
The CT scan shows no signs of bleeding or injuries or tumors   Your blood work shows that your salt level is slightly low and your potassium level is slightly low  Please add some additional saltier food this week and take your potassium as prescribed.  We have given you additional potassium here.  Return to the emergency department for severe or worsening symptoms

## 2018-06-23 DEATH — deceased

## 2019-07-20 IMAGING — DX DG CHEST 1V
1 series · 1 of 1 positions shown · non-contrast
Comparison: 10/12/2014

CLINICAL DATA: Left hip fracture.

EXAM:
CHEST 1 VIEW

[chest ap]
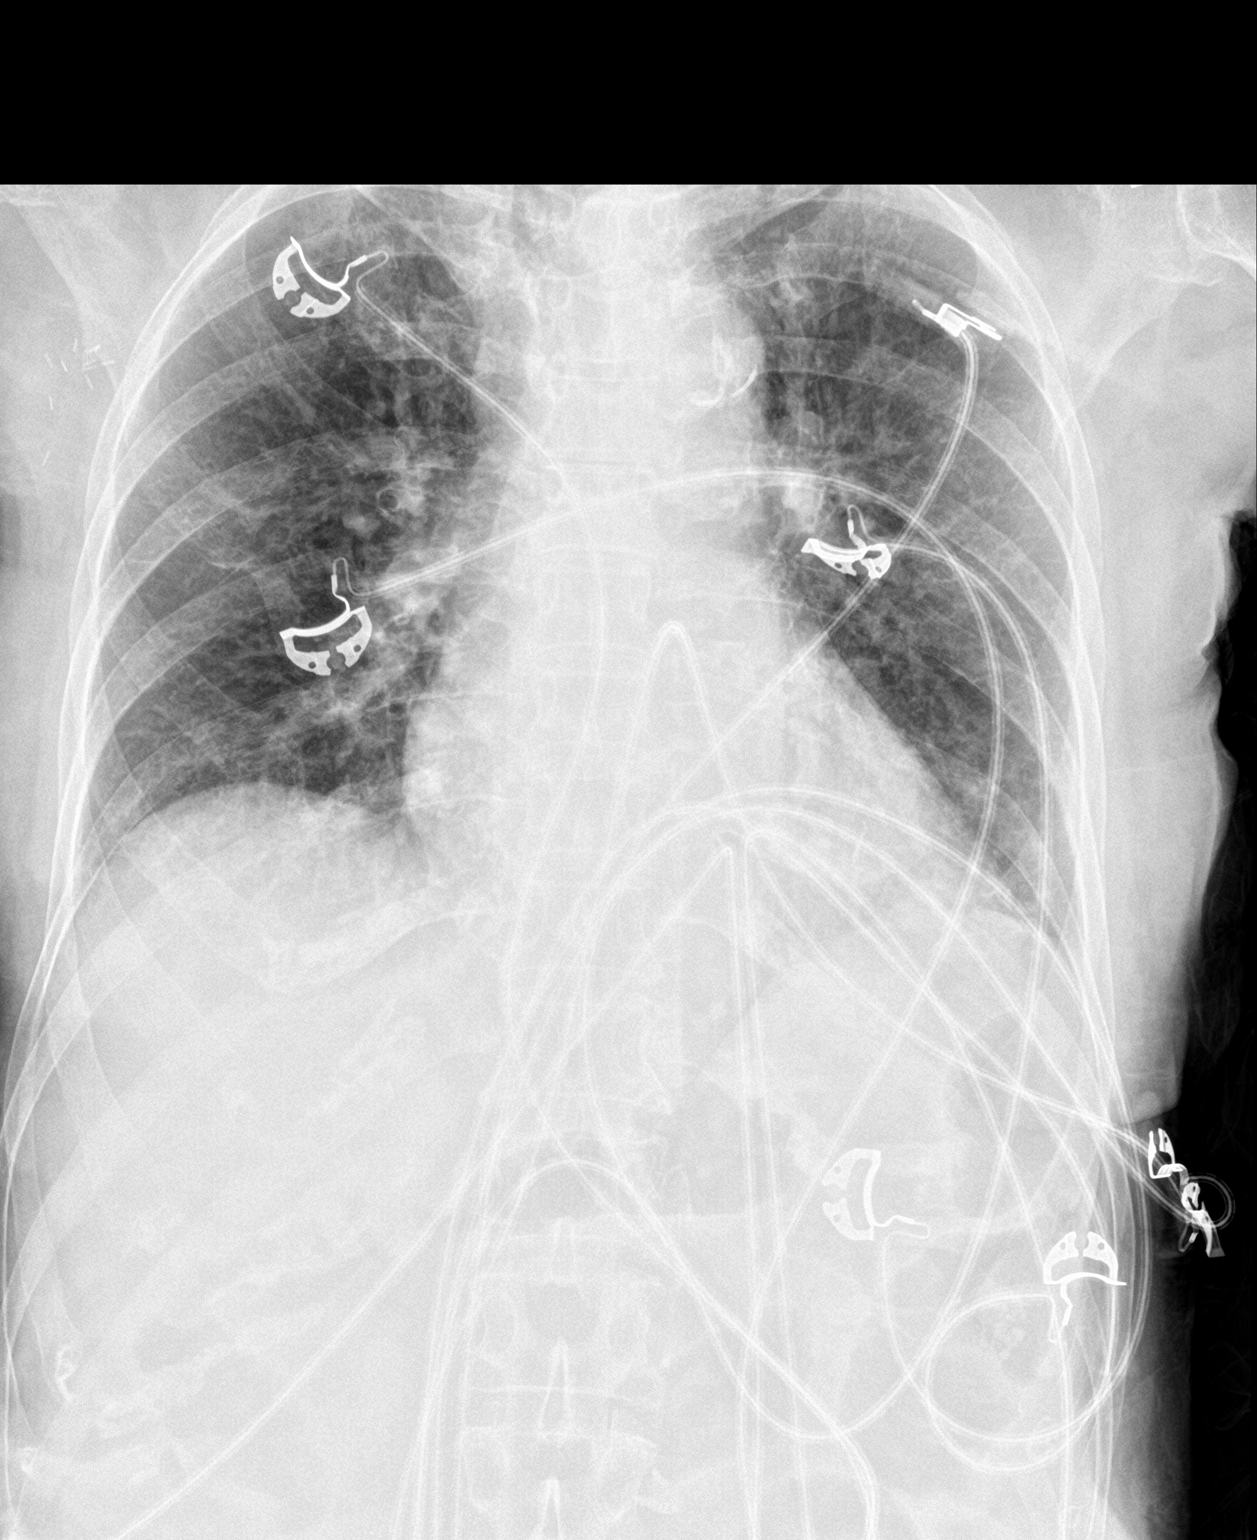

[1 of 1 positions shown; findings below may reference images not displayed]

FINDINGS: Mild cardiac enlargement. Aortic atherosclerosis noted. Diffuse
coarsened interstitial markings are identified throughout both
lungs. No superimposed pleural effusion, pulmonary edema or airspace
consolidation.
IMPRESSION: 1. Chronic interstitial coarsening without superimposed acute
abnormality.
2.  Aortic Atherosclerosis ([id number]).
3. Mild cardiac enlargement.

## 2019-07-20 IMAGING — RF DG FEMUR 2+V*L*
1 series · 8 of 8 positions shown · non-contrast
Comparison: 03/19/2017

CLINICAL DATA: Internal fixation

EXAM:
LEFT FEMUR 2 VIEWS

[Series 1: run · 8 of 8 slices shown]
[im 1/8]
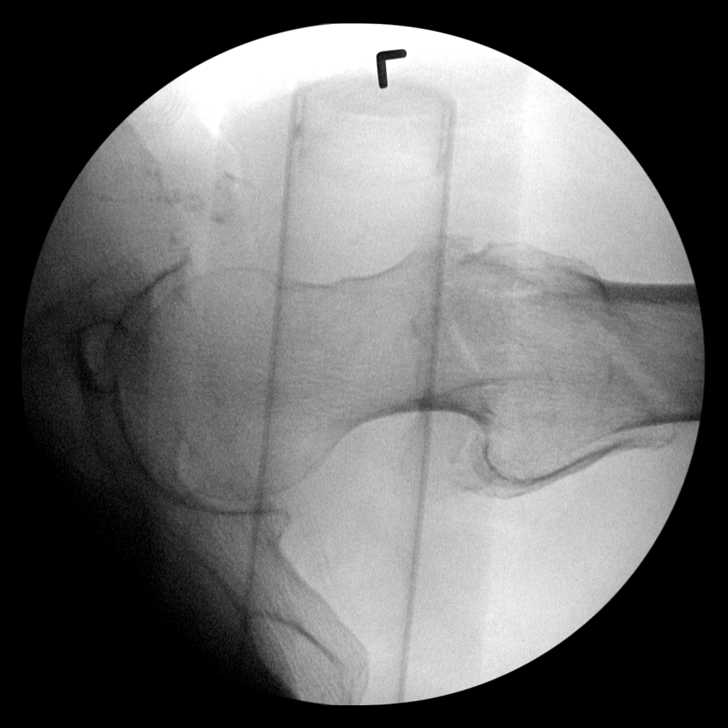
[im 2/8]
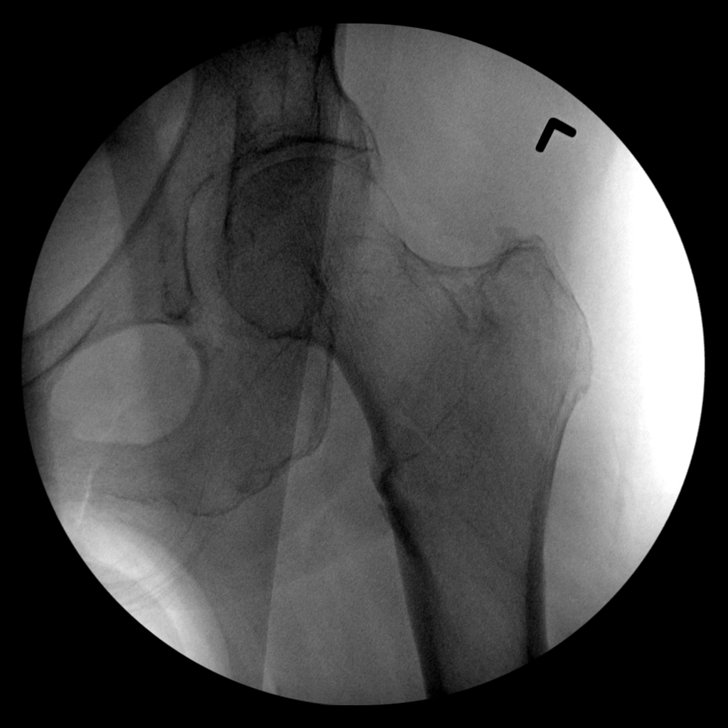
[im 3/8]
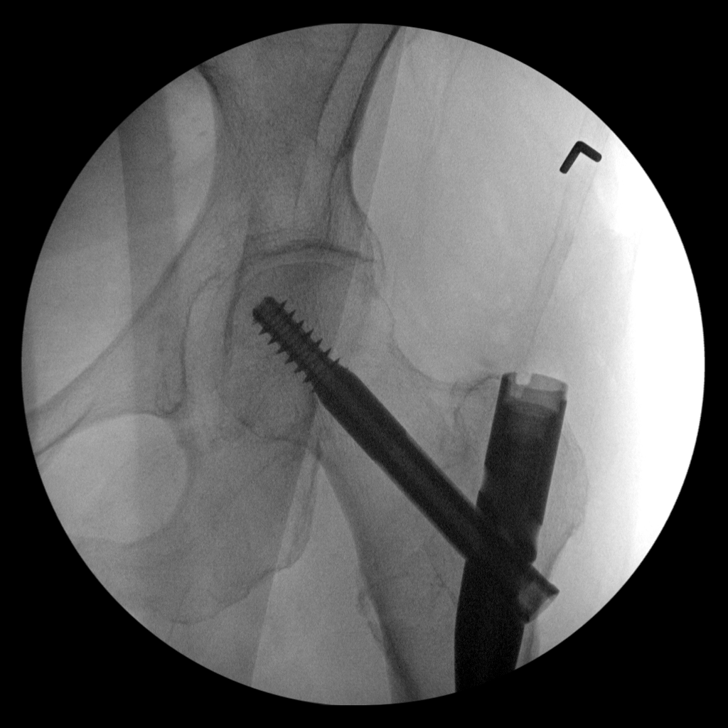
[im 4/8]
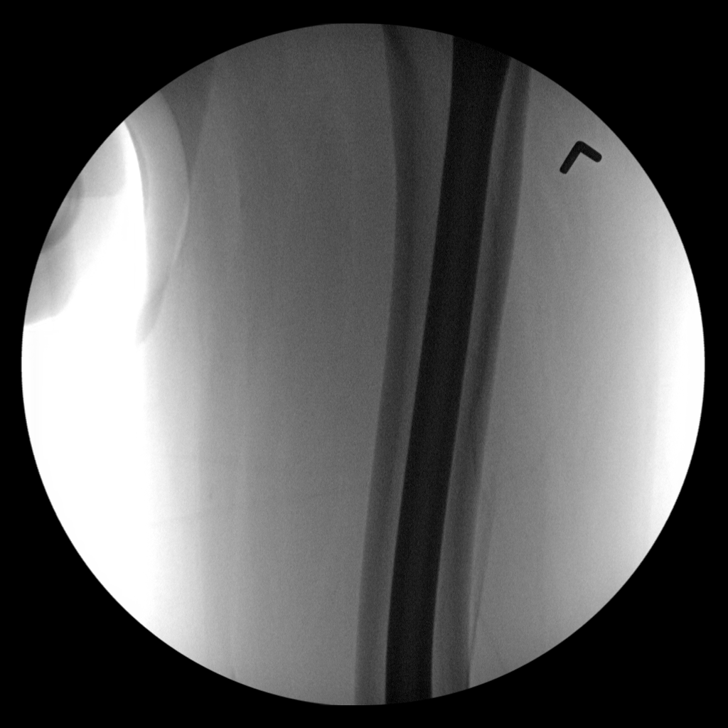
[im 5/8]
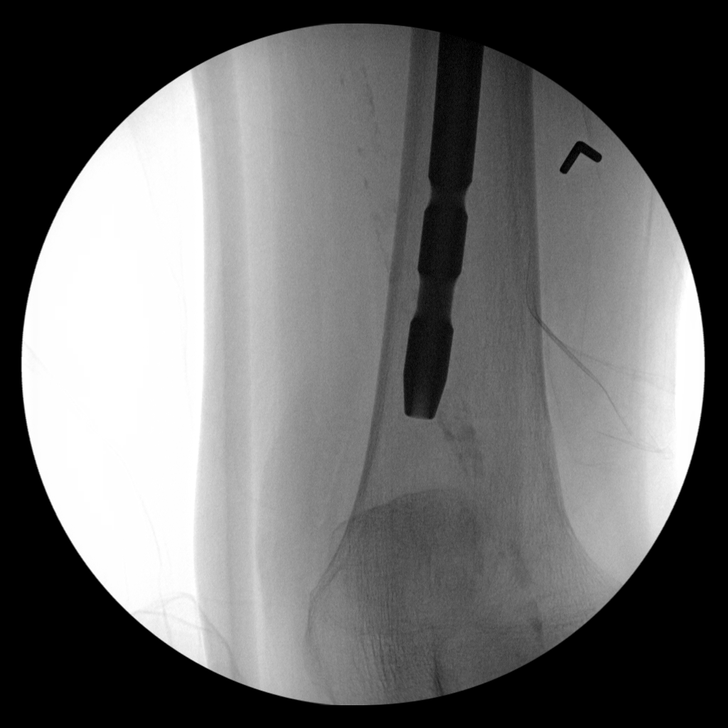
[im 6/8]
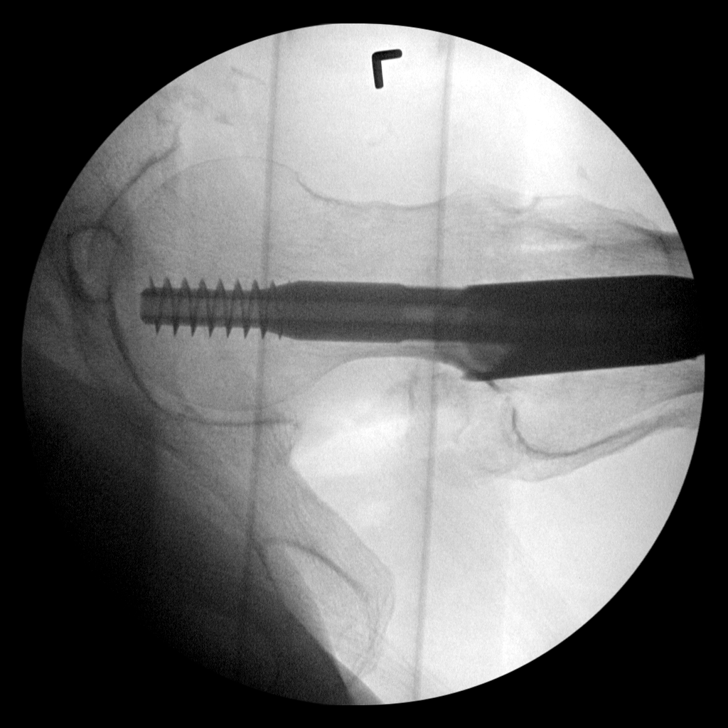
[im 7/8]
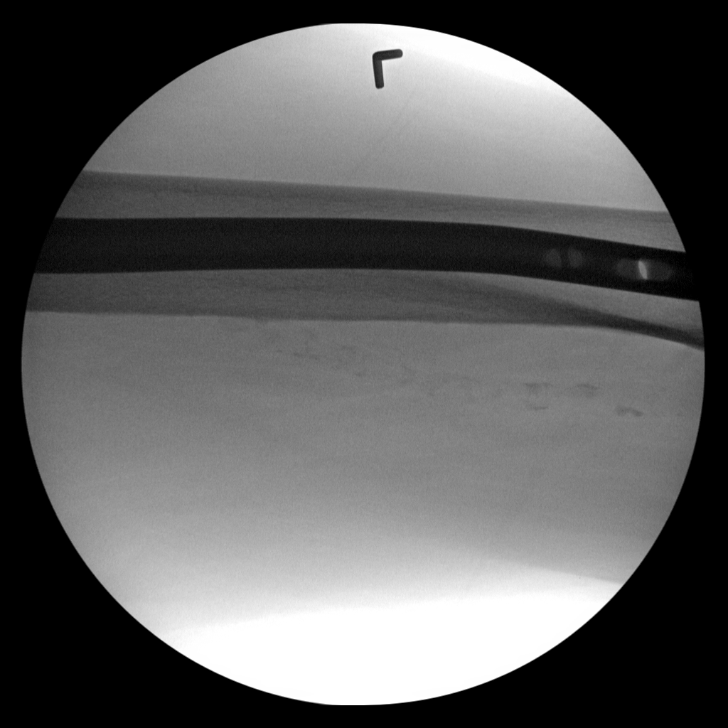
[im 8/8]
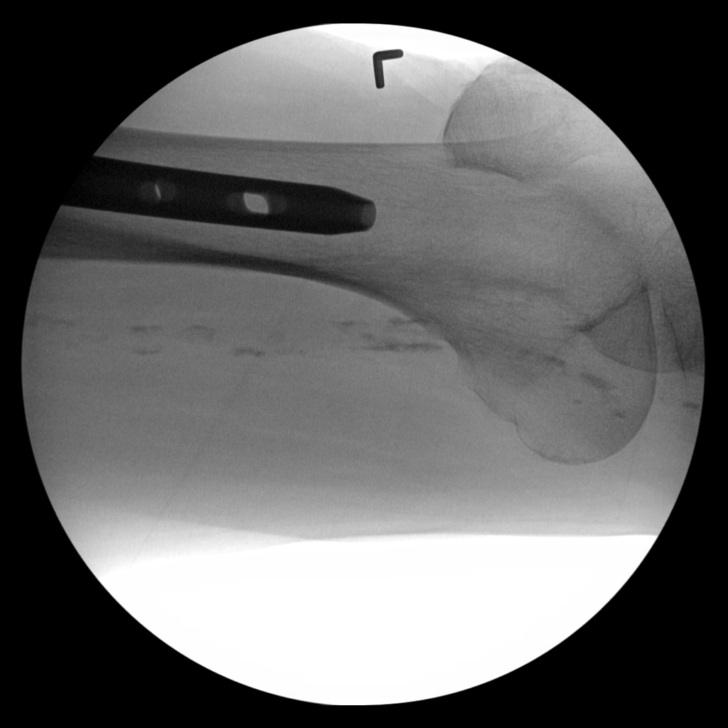

[8 of 8 positions shown; findings below may reference images not displayed]

FINDINGS: Placement of intramedullary nail and dynamic hip screw across the
left femoral intertrochanteric fracture. Anatomic alignment. No
hardware complicating feature.
IMPRESSION: Internal fixation.  Anatomic alignment without complicating feature.

## 2019-07-20 IMAGING — DX DG PORTABLE PELVIS
2 series · 2 of 2 positions shown · non-contrast
Comparison: 03/19/2017

CLINICAL DATA: Status post ORIF of fracture

EXAM:
PORTABLE PELVIS 1-2 VIEWS

[pelvis ap (1 of 2)]
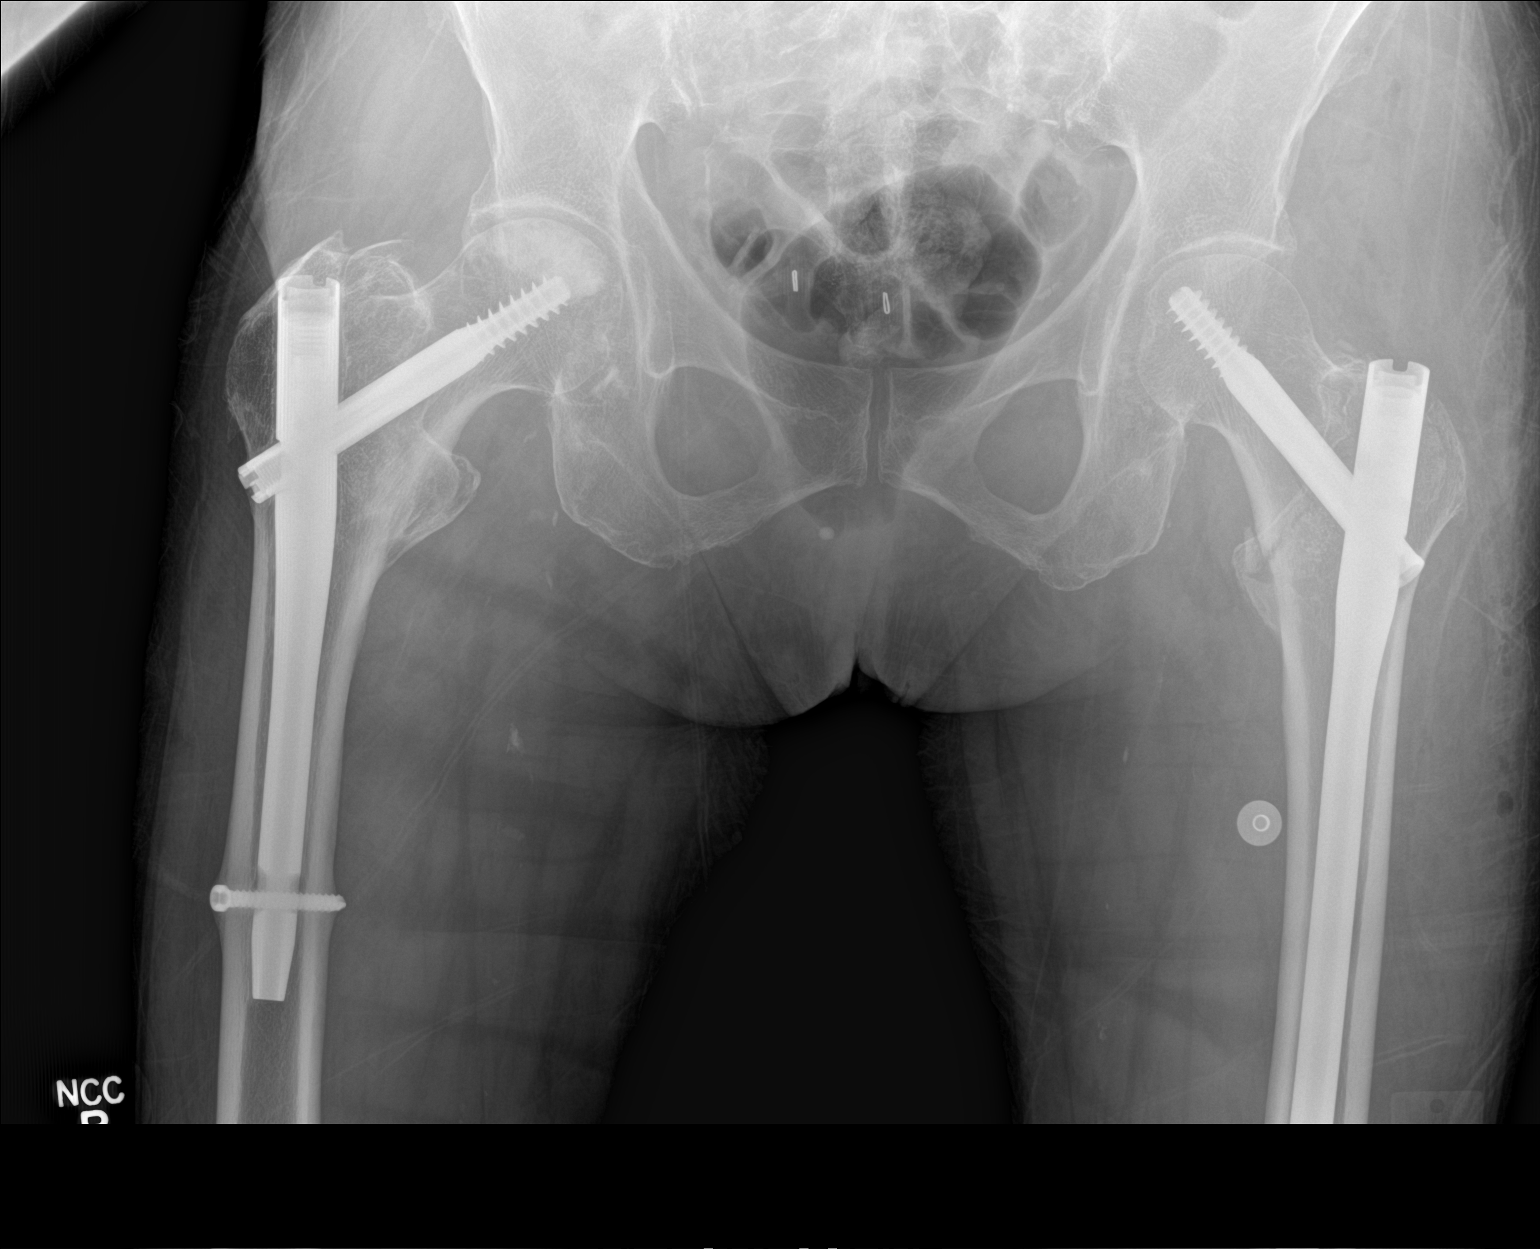

[pelvis ap (2 of 2)]
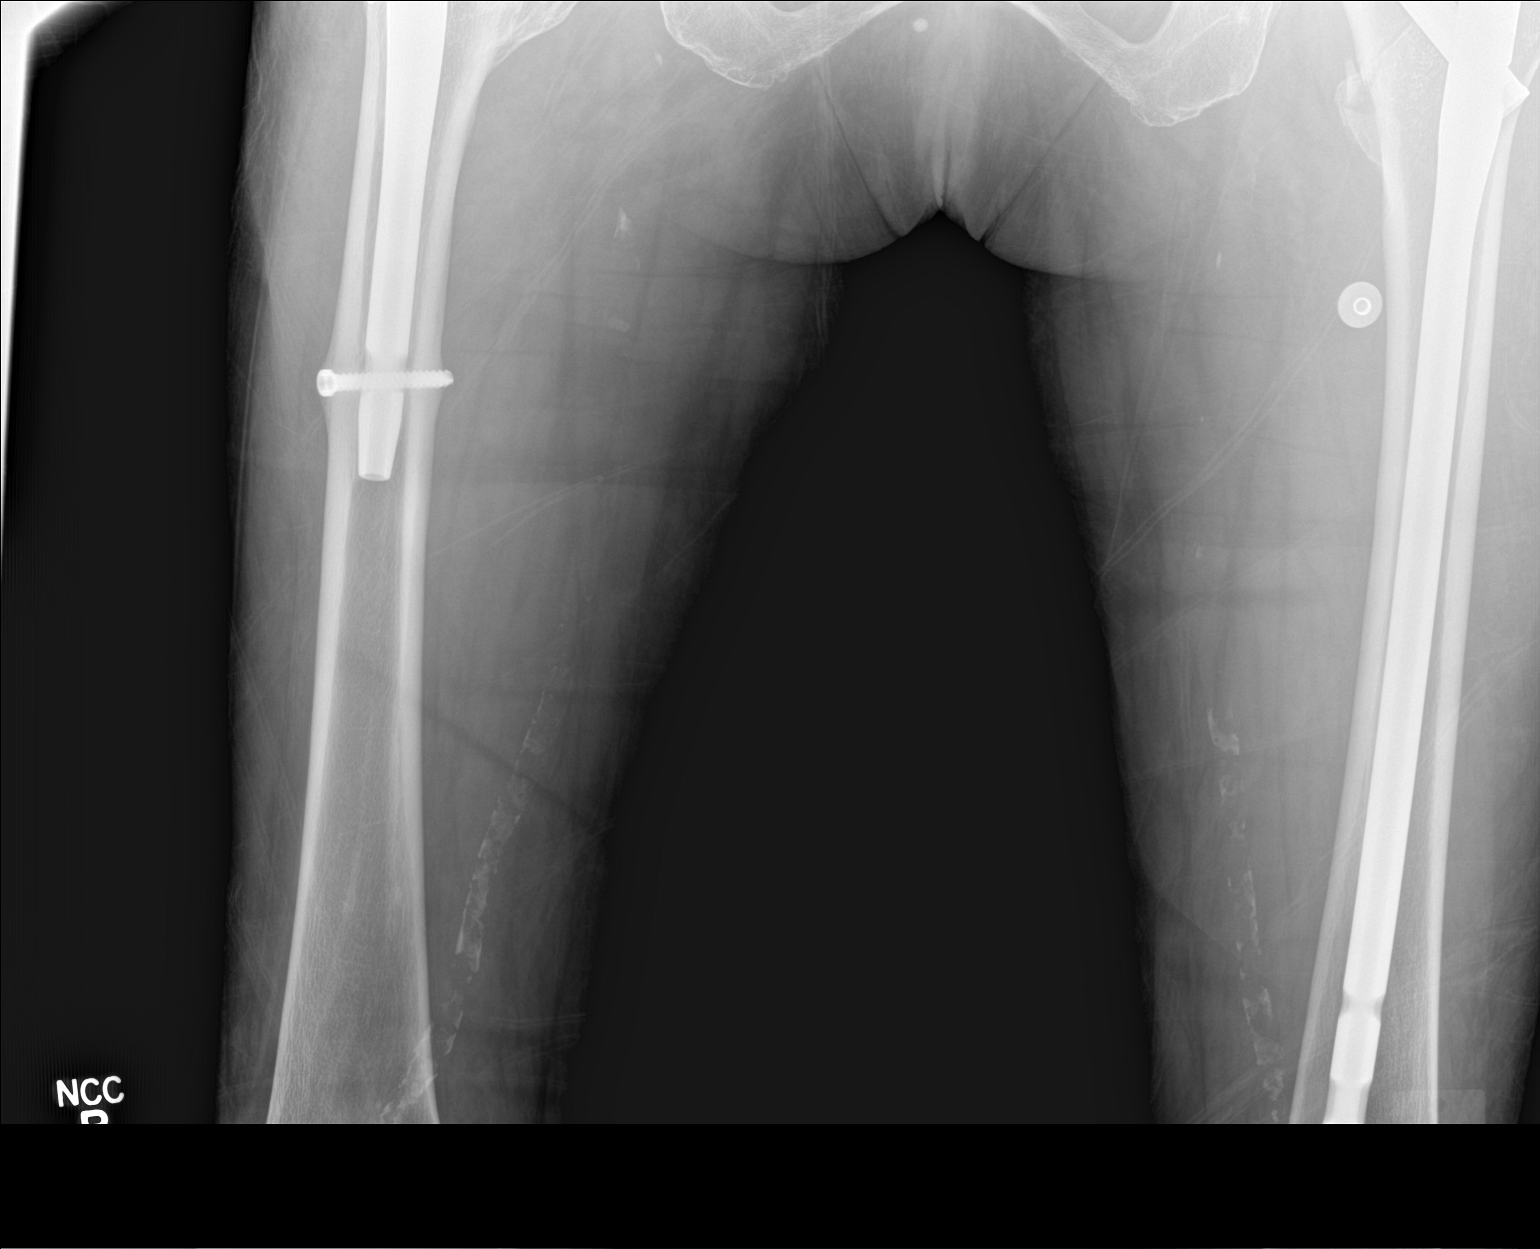

[2 of 2 positions shown; findings below may reference images not displayed]

FINDINGS: Prior intramedullary rod and screw fixation of right femur. Pubic
symphysis and rami are intact. Interval intramedullary rod fixation
of the left femur for intertrochanteric fracture. Small amount of
soft tissue gas lateral hip and proximal thigh. Vascular
calcifications.
IMPRESSION: Intramedullary rod fixation of comminuted left intertrochanteric
fracture with expected postsurgical changes

## 2019-07-20 IMAGING — DX DG HIP (WITH OR WITHOUT PELVIS) 2-3V*L*
3 series · 3 of 3 positions shown · non-contrast
Comparison: None.

CLINICAL DATA: Left hip pain.  Status post fall.

EXAM:
DG HIP (WITH OR WITHOUT PELVIS) 2-3V LEFT

[pelvis ap]
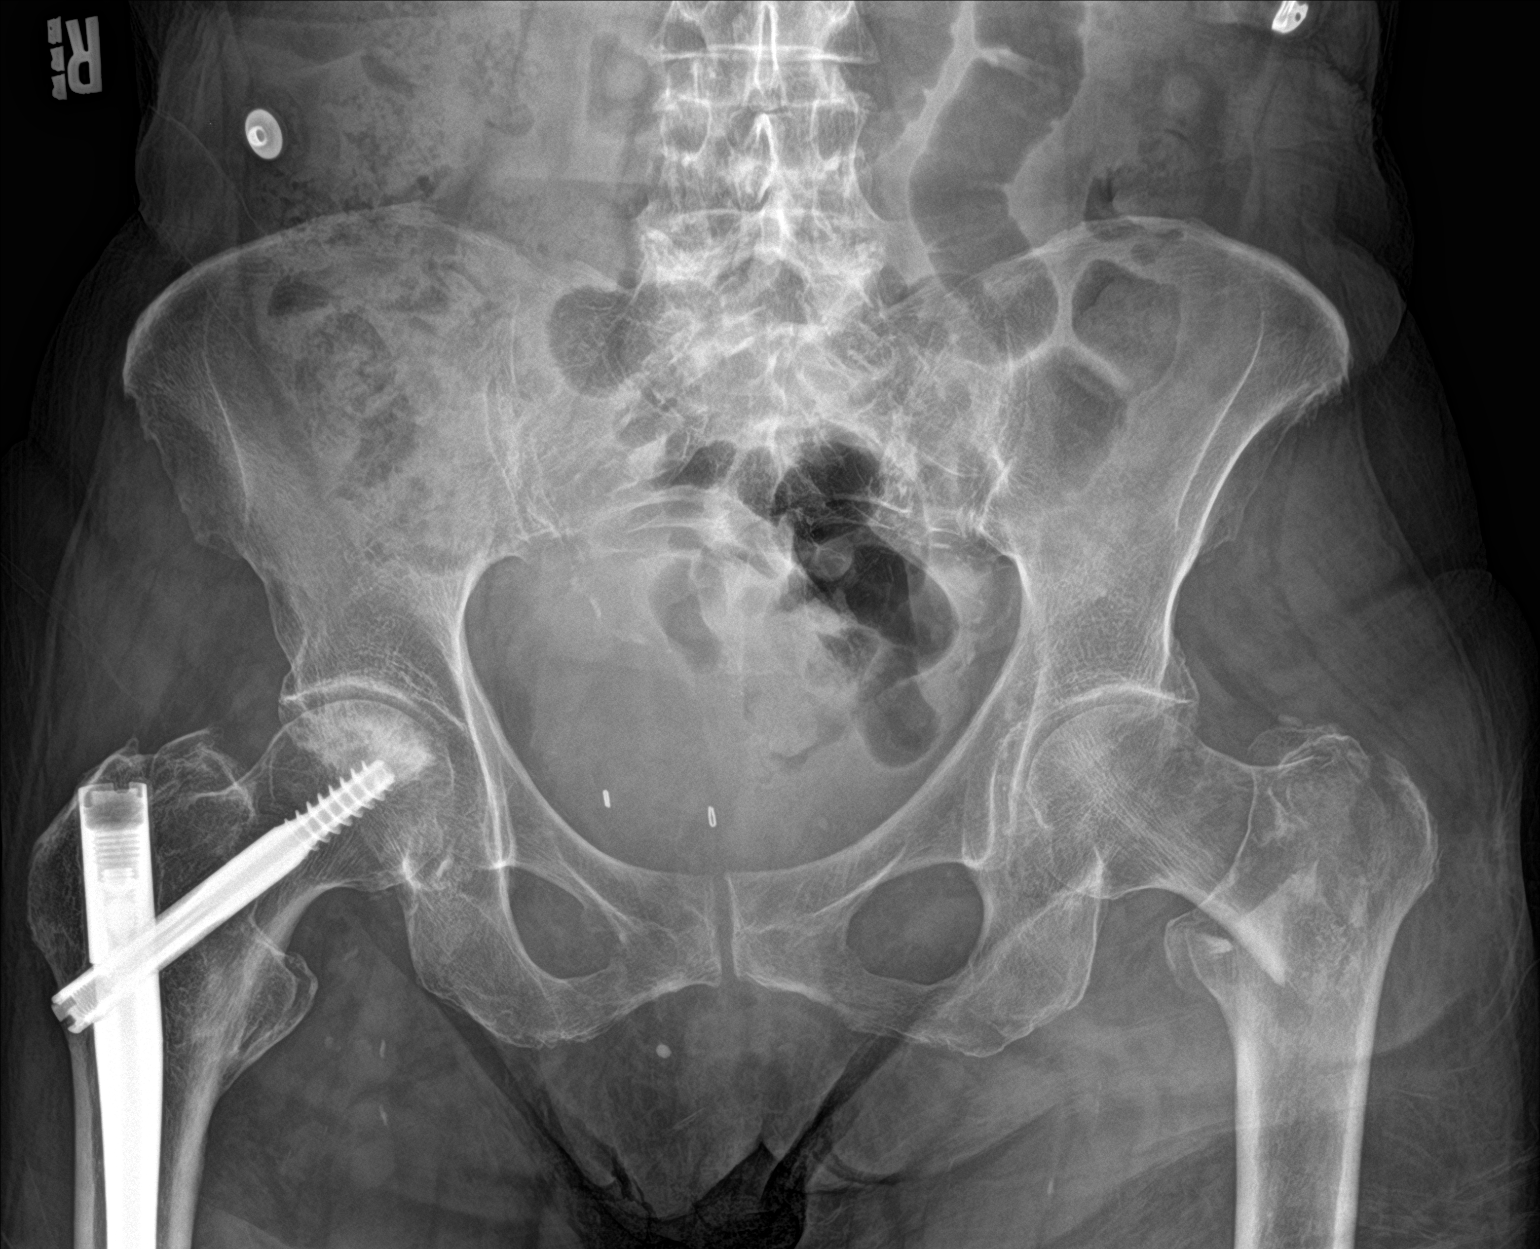

[hip ap]
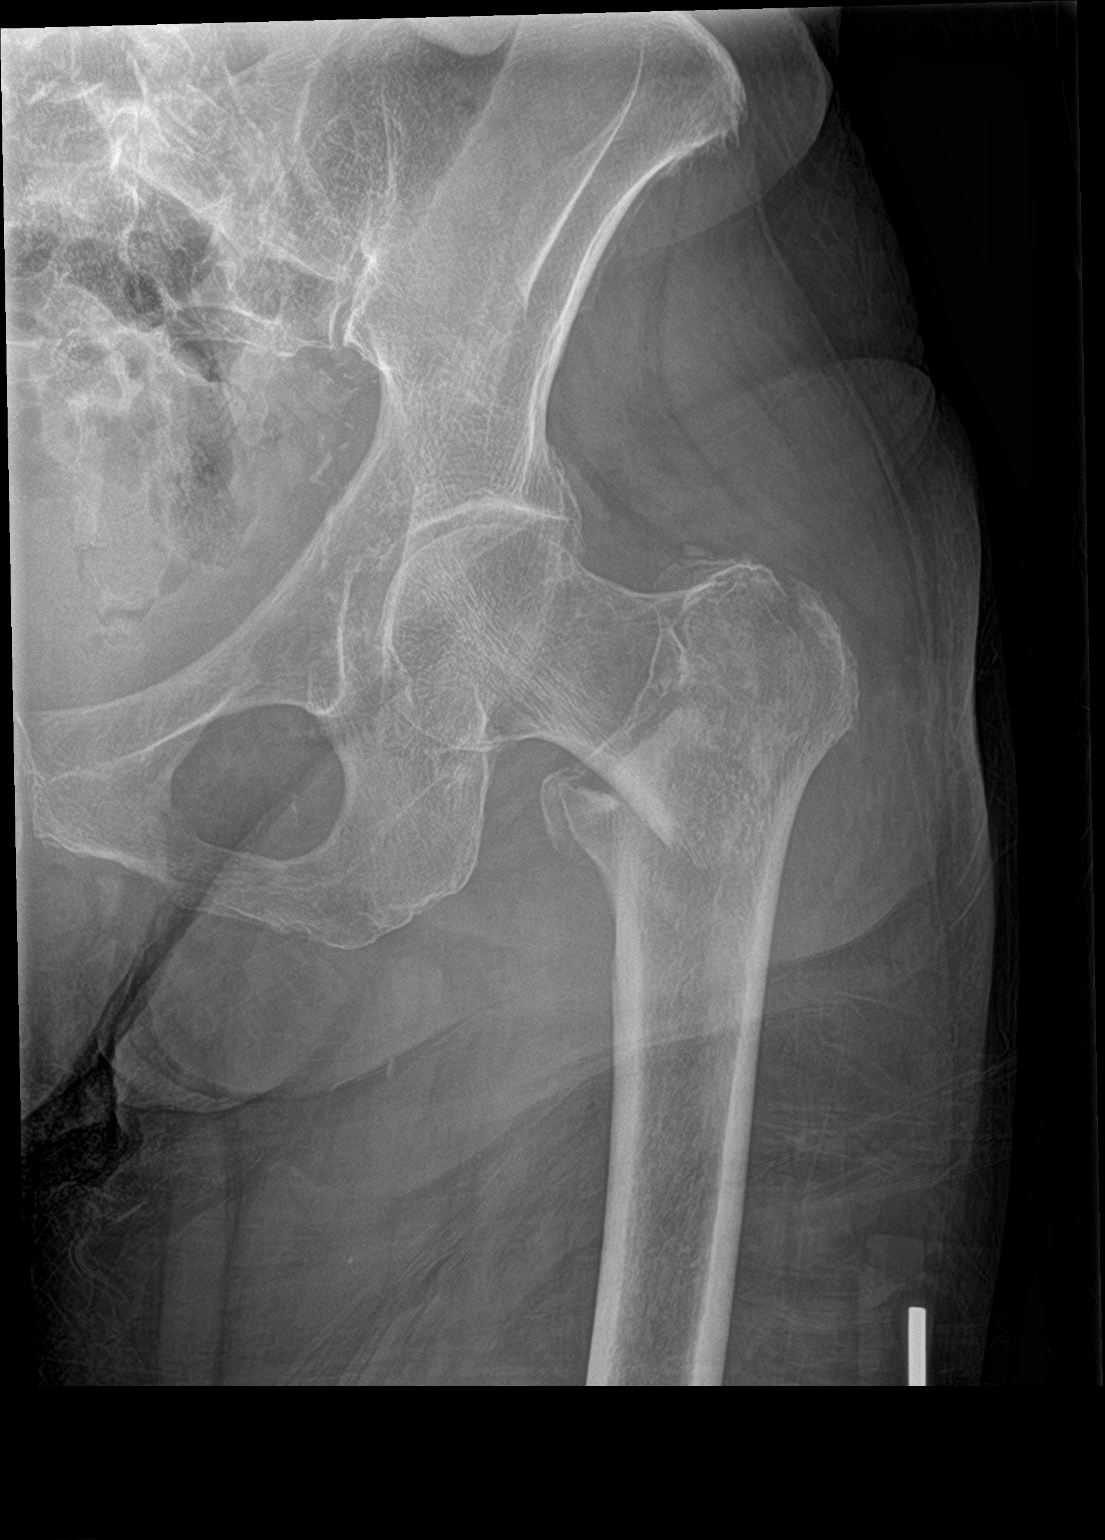

[hip lat]
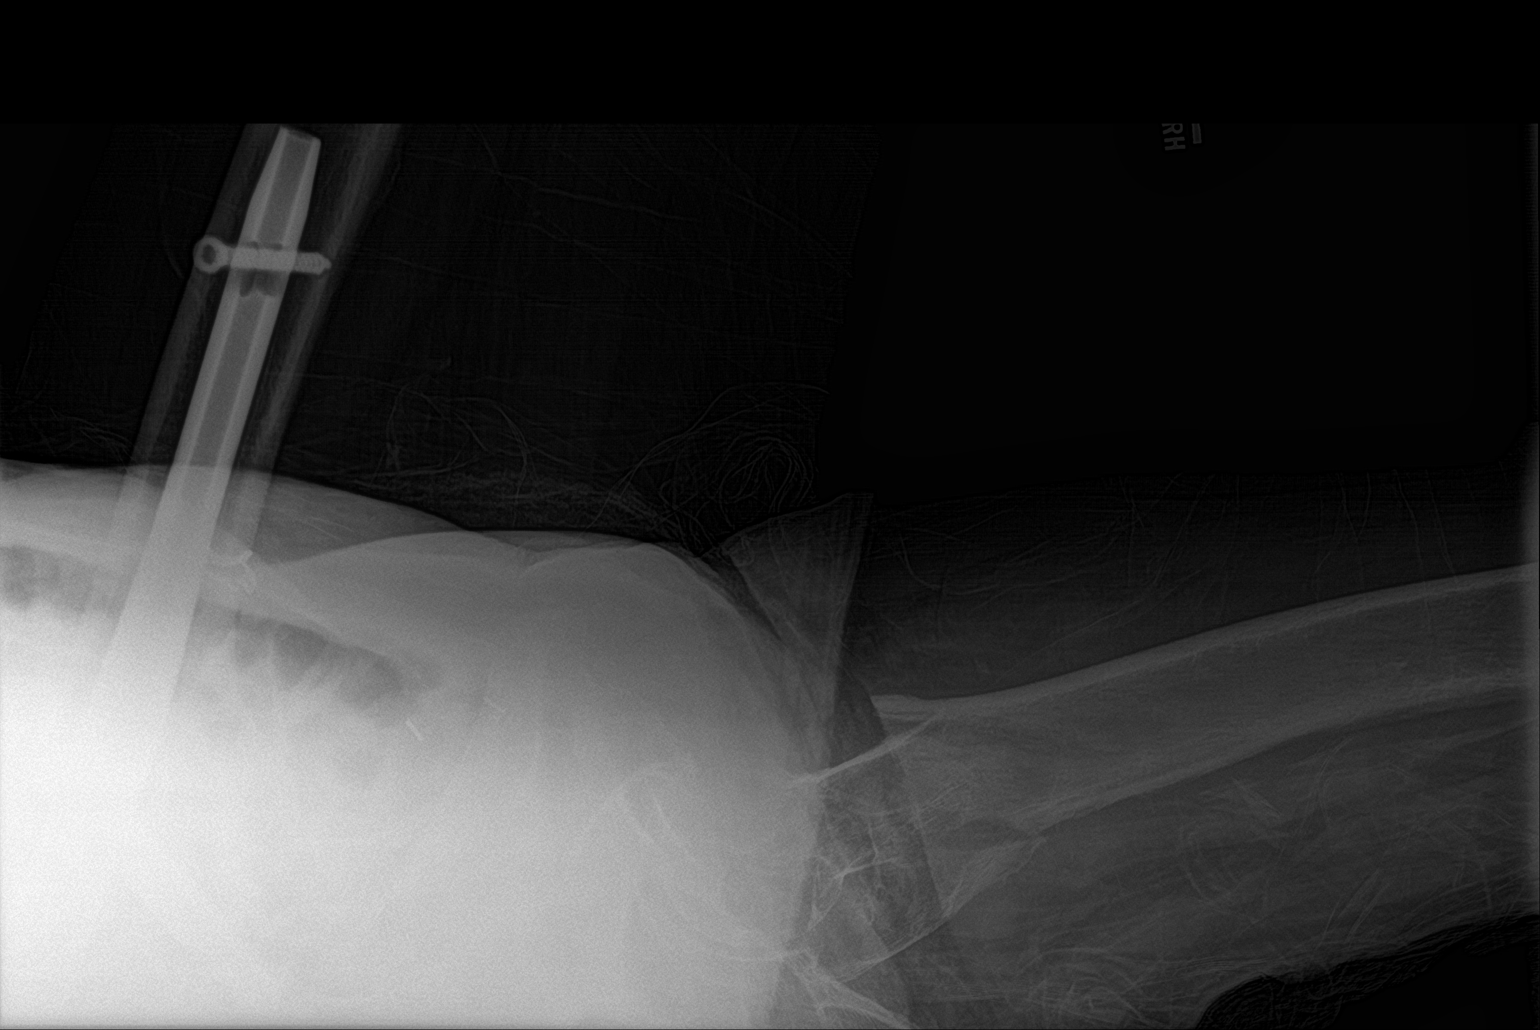

[3 of 3 positions shown; findings below may reference images not displayed]

FINDINGS: Generalized osteopenia.

Comminuted and mildly displaced left intertrochanteric fracture. No
hip dislocation.

Prior right intertrochanteric ORIF with a healed intertrochanteric
fracture. Subchondral sclerosis in the right femoral head most
concerning for avascular necrosis.

No aggressive osseous lesion.
IMPRESSION: 1. Acute comminuted and mildly displaced left intertrochanteric
fracture.
